# Patient Record
Sex: Female | Born: 1990
Health system: Southern US, Community
[De-identification: ages and names within clinical notes are randomized; demographics above are authoritative.]

## PROBLEM LIST (undated history)

## (undated) DIAGNOSIS — M419 Scoliosis, unspecified: Secondary | ICD-10-CM

---
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
## 2010-02-07 NOTE — Letter (Signed)
Summary: *Orthopedic No Show Letter  [facility]  [address]  [place], [place] [address]   Phone: [phone]  Fax: [phone]      05/07/2009    Bernette Redbird [address] [place], [place]  [address]    Dear Ms. Briski,   Our records indicate that you missed your scheduled appointment with Dr. Beaulah Corin on Monday, 05/02/09.  Please contact this office to reschedule your appointment as soon as possible.  It is important that you keep your scheduled appointments with your physician, so we can provide you the best care possible.        Sincerely,    Dr. Terrance Mass, MD [facility] Phone [phone]

## 2010-02-07 NOTE — Progress Notes (Signed)
Summary: Progress note  Progress note   Imported By: Jacklynn Ganong 04/28/2009 15:05:46  _____________________________________________________________________  External Attachment:    Type:   Image     Comment:   External Document

## 2010-02-07 NOTE — Progress Notes (Signed)
Summary: Initial  evaluation  Initial  evaluation   Imported By: Jacklynn Ganong 04/28/2009 16:40:26  _____________________________________________________________________  External Attachment:    Type:   Image     Comment:   External Document

## 2010-03-21 LAB — PREGNANCY, URINE: Preg Test, Ur: NEGATIVE

## 2010-03-21 LAB — POCT PREGNANCY, URINE: Preg Test, Ur: NEGATIVE

## 2010-03-21 LAB — URINALYSIS, ROUTINE W REFLEX MICROSCOPIC
Bilirubin Urine: NEGATIVE
Hgb urine dipstick: NEGATIVE
Nitrite: NEGATIVE
Specific Gravity, Urine: 1.025 (ref 1.005–1.030)
pH: 6 (ref 5.0–8.0)

## 2010-05-28 ENCOUNTER — Emergency Department (HOSPITAL_COMMUNITY)
Admission: EM | Admit: 2010-05-28 | Discharge: 2010-05-28 | Disposition: A | Discharge status: Home / Self Care | Payer: Self-pay | Attending: Emergency Medicine | Admitting: Emergency Medicine

## 2010-05-28 ENCOUNTER — Emergency Department (HOSPITAL_COMMUNITY): Payer: Self-pay

## 2010-05-28 DIAGNOSIS — F1010 Alcohol abuse, uncomplicated: Secondary | ICD-10-CM | POA: Insufficient documentation

## 2010-05-28 DIAGNOSIS — F411 Generalized anxiety disorder: Secondary | ICD-10-CM | POA: Insufficient documentation

## 2010-05-28 DIAGNOSIS — R0602 Shortness of breath: Secondary | ICD-10-CM | POA: Insufficient documentation

## 2010-05-28 LAB — BASIC METABOLIC PANEL
BUN: 11 mg/dL (ref 6–23)
Calcium: 10.3 mg/dL (ref 8.4–10.5)
Creatinine, Ser: 0.78 mg/dL (ref 0.4–1.2)
GFR calc non Af Amer: >60 mL/min (ref >60)
Glucose, Bld: 133 mg/dL — ABNORMAL HIGH (ref 70–99)
Potassium: 3 mEq/L — ABNORMAL LOW (ref 3.5–5.1)

## 2010-05-28 LAB — URINALYSIS, ROUTINE W REFLEX MICROSCOPIC
Glucose, UA: NEGATIVE mg/dL
Nitrite: NEGATIVE
Protein, ur: NEGATIVE mg/dL

## 2010-05-28 LAB — PREGNANCY, URINE: Preg Test, Ur: NEGATIVE

## 2010-05-28 LAB — D-DIMER, QUANTITATIVE: D-Dimer, Quant: 0.77 ug/mL-FEU — ABNORMAL HIGH (ref 0.00–0.48)

## 2010-05-28 LAB — CBC
Hemoglobin: 10.8 g/dL — ABNORMAL LOW (ref 12.0–15.0)
MCHC: 31 g/dL (ref 30.0–36.0)
Platelets: 479 10*3/uL — ABNORMAL HIGH (ref 150–400)
RDW: 16.7 % — ABNORMAL HIGH (ref 11.5–15.5)

## 2010-05-28 LAB — ETHANOL: Alcohol, Ethyl (B): 113 mg/dL — ABNORMAL HIGH (ref 0–10)

## 2010-05-28 LAB — RAPID URINE DRUG SCREEN, HOSP PERFORMED: Barbiturates: NOT DETECTED

## 2010-05-28 MED ORDER — IOHEXOL 350 MG/ML SOLN
100.0000 mL | Freq: Once | INTRAVENOUS | Status: AC | PRN
  Administered 2010-05-28: 100 mL via INTRAVENOUS

## 2010-12-08 ENCOUNTER — Encounter: Payer: Self-pay | Admitting: *Deleted

## 2010-12-08 ENCOUNTER — Emergency Department (HOSPITAL_COMMUNITY)
Admission: EM | Admit: 2010-12-08 | Discharge: 2010-12-08 | Disposition: A | Discharge status: Home / Self Care | Payer: Self-pay | Attending: Emergency Medicine | Admitting: Emergency Medicine

## 2010-12-08 DIAGNOSIS — F17200 Nicotine dependence, unspecified, uncomplicated: Secondary | ICD-10-CM | POA: Insufficient documentation

## 2010-12-08 DIAGNOSIS — M4120 Other idiopathic scoliosis, site unspecified: Secondary | ICD-10-CM | POA: Insufficient documentation

## 2010-12-08 DIAGNOSIS — M545 Low back pain, unspecified: Secondary | ICD-10-CM | POA: Insufficient documentation

## 2010-12-08 HISTORY — DX: Scoliosis, unspecified: M41.9

## 2010-12-08 LAB — PREGNANCY, URINE: Preg Test, Ur: NEGATIVE

## 2010-12-08 LAB — URINALYSIS, ROUTINE W REFLEX MICROSCOPIC
Glucose, UA: NEGATIVE mg/dL
Hgb urine dipstick: NEGATIVE
Leukocytes, UA: NEGATIVE
Specific Gravity, Urine: >1.03 — ABNORMAL HIGH (ref 1.005–1.030)
pH: 6 (ref 5.0–8.0)

## 2010-12-08 MED ORDER — OXYCODONE-ACETAMINOPHEN 5-325 MG PO TABS
1.0000 | ORAL_TABLET | Freq: Once | ORAL | Status: AC
  Administered 2010-12-08: 1 via ORAL
  Filled 2010-12-08: qty 1

## 2010-12-08 MED ORDER — OXYCODONE-ACETAMINOPHEN 5-325 MG PO TABS: 1.0000 | ORAL_TABLET | ORAL | Status: AC | PRN

## 2010-12-08 MED ORDER — PREDNISONE 20 MG PO TABS
60.0000 mg | ORAL_TABLET | Freq: Once | ORAL | Status: AC
  Administered 2010-12-08: 60 mg via ORAL
  Filled 2010-12-08: qty 3
  Filled 2010-12-08: qty 2

## 2010-12-08 MED ORDER — PREDNISONE 20 MG PO TABS: ORAL_TABLET | ORAL | Status: DC

## 2010-12-08 NOTE — ED Provider Notes (Signed)
History     CSN: [id number] Arrival date & time: 12/08/2010  9:45 PM   First MD Initiated Contact with Patient 12/08/10 2202      Chief Complaint  Patient presents with  . Back Pain    (Consider location/radiation/quality/duration/timing/severity/associated sxs/prior treatment) Patient is a 20 y.o. female presenting with back pain. The history is provided by the patient.  Back Pain  This is a recurrent problem. The current episode started 12 to 24 hours ago (Patient woke today with pain in her left lower back.  she denies injury, but does have known scoliosis with infrequent low back pain.). The problem occurs constantly. The problem has not changed since onset.The pain is associated with no known injury. The pain is present in the lumbar spine. The quality of the pain is described as aching. The pain does not radiate. The pain is at a severity of 5/10. The pain is moderate. The symptoms are aggravated by bending, twisting and certain positions. Pertinent negatives include no chest pain, no fever, no numbness, no headaches, no abdominal pain, no bowel incontinence, no dysuria, no leg pain, no paresthesias, no paresis, no tingling and no weakness. She has tried nothing for the symptoms.    Past Medical History  Diagnosis Date  . Scoliosis     History reviewed. No pertinent past surgical history.  History reviewed. No pertinent family history.  History  Substance Use Topics  . Smoking status: Current Some Day Smoker  . Smokeless tobacco: Not on file  . Alcohol Use: No    OB History    Grav Para Term Preterm Abortions TAB SAB Ect Mult Living                  Review of Systems  Constitutional: Negative for fever.  HENT: Negative for congestion, sore throat and neck pain.   Eyes: Negative.   Respiratory: Negative for chest tightness and shortness of breath.   Cardiovascular: Negative for chest pain.  Gastrointestinal: Negative for nausea, abdominal pain and bowel  incontinence.  Genitourinary: Negative.  Negative for dysuria.  Musculoskeletal: Positive for back pain. Negative for joint swelling and arthralgias.  Skin: Negative.  Negative for rash and wound.  Neurological: Negative for dizziness, tingling, weakness, light-headedness, numbness, headaches and paresthesias.  Hematological: Negative.   Psychiatric/Behavioral: Negative.     Allergies  Review of patient's allergies indicates no known allergies.  Home Medications   Current Outpatient Rx  Name Route Sig Dispense Refill  . OXYCODONE-ACETAMINOPHEN 5-325 MG PO TABS Oral Take 1 tablet by mouth every 4 (four) hours as needed for pain. 20 tablet 0  . PREDNISONE 20 MG PO TABS  Take 6 tabs daily by mouth for 1 day,  Then 5 tabs daily for 2 days,  4 tabs daily for 2 days,  3 tabs daily for 2 days,  2 tabs daily for 2 days,  Then 1 tab daily for 2 days.   36 tablet 0    BP 114/75  Pulse 82  Temp(Src) 98.5 F (36.9 C) (Oral)  Resp 18  Ht 5\' 2"  (1.575 m)  Wt 94 lb 6 oz (42.808 kg)  BMI 17.26 kg/m2  SpO2 100%  LMP 11/09/2010  Physical Exam  Nursing note and vitals reviewed. Constitutional: She is oriented to person, place, and time. She appears well-developed and well-nourished.  HENT:  Head: Normocephalic.  Eyes: Conjunctivae are normal.  Neck: Normal range of motion. Neck supple.  Cardiovascular: Regular rhythm and intact distal pulses.  Pedal pulses normal.  Pulmonary/Chest: Effort normal. She has no wheezes.  Abdominal: Soft. Bowel sounds are normal. She exhibits no distension and no mass.  Musculoskeletal: Normal range of motion. She exhibits no edema.       Lumbar back: She exhibits tenderness. She exhibits no swelling, no edema and no spasm.  Neurological: She is alert and oriented to person, place, and time. She has normal strength. She displays no atrophy and no tremor. No cranial nerve deficit or sensory deficit. Gait normal.  Reflex Scores:      Patellar reflexes are  2+ on the right side and 2+ on the left side.      Achilles reflexes are 2+ on the right side and 2+ on the left side.      No strength deficit noted in hip and knee flexor and extensor muscle groups.  Ankle flexion and extension intact.  Skin: Skin is warm and dry.  Psychiatric: She has a normal mood and affect.    ED Course  Procedures (including critical care time)  Labs Reviewed  URINALYSIS, ROUTINE W REFLEX MICROSCOPIC - Abnormal; Notable for the following:    Specific Gravity, Urine >1.030 (*)    All other components within normal limits  PREGNANCY, URINE   No results found.   1. Lumbar back pain       MDM  Low back pain without neuro deficit by exam or history.  Last tx with ibuprofen and flexeril per review of last visit for this complaint - pt reports did not get relief.  Will try prednisone taper,  Oxycodone ,  Strongly encourage pt see ortho f/u.        Candis Musa, PA 12/08/10 2349

## 2010-12-08 NOTE — ED Notes (Signed)
Low back pain  More to lt side.  No known injury

## 2010-12-08 NOTE — ED Notes (Addendum)
C/o lower back pain more on left side that radiates down into left thigh, denies any injury

## 2010-12-10 NOTE — ED Provider Notes (Signed)
Medical screening examination/treatment/procedure(s) were performed by non-physician practitioner and as supervising physician I was immediately available for consultation/collaboration.  Donnetta Hutching, MD 12/10/10 [phone]

## 2010-12-14 ENCOUNTER — Emergency Department (HOSPITAL_COMMUNITY): Payer: Self-pay

## 2010-12-14 ENCOUNTER — Emergency Department (HOSPITAL_COMMUNITY)
Admission: EM | Admit: 2010-12-14 | Discharge: 2010-12-14 | Disposition: A | Discharge status: Home / Self Care | Payer: Self-pay | Attending: Emergency Medicine | Admitting: Emergency Medicine

## 2010-12-14 ENCOUNTER — Encounter (HOSPITAL_COMMUNITY): Payer: Self-pay | Admitting: *Deleted

## 2010-12-14 DIAGNOSIS — M4120 Other idiopathic scoliosis, site unspecified: Secondary | ICD-10-CM | POA: Insufficient documentation

## 2010-12-14 DIAGNOSIS — M545 Low back pain, unspecified: Secondary | ICD-10-CM | POA: Insufficient documentation

## 2010-12-14 DIAGNOSIS — X58XXXA Exposure to other specified factors, initial encounter: Secondary | ICD-10-CM | POA: Insufficient documentation

## 2010-12-14 DIAGNOSIS — S335XXA Sprain of ligaments of lumbar spine, initial encounter: Secondary | ICD-10-CM | POA: Insufficient documentation

## 2010-12-14 DIAGNOSIS — F17200 Nicotine dependence, unspecified, uncomplicated: Secondary | ICD-10-CM | POA: Insufficient documentation

## 2010-12-14 LAB — POCT PREGNANCY, URINE: Preg Test, Ur: NEGATIVE

## 2010-12-14 MED ORDER — OXYCODONE-ACETAMINOPHEN 5-325 MG PO TABS
1.0000 | ORAL_TABLET | Freq: Once | ORAL | Status: AC
  Administered 2010-12-14: 1 via ORAL
  Filled 2010-12-14: qty 1

## 2010-12-14 MED ORDER — OXYCODONE-ACETAMINOPHEN 5-325 MG PO TABS: 1.0000 | ORAL_TABLET | ORAL | Status: AC | PRN

## 2010-12-14 MED ORDER — CYCLOBENZAPRINE HCL 5 MG PO TABS: 5.0000 mg | ORAL_TABLET | Freq: Three times a day (TID) | ORAL | Status: AC | PRN

## 2010-12-14 MED ORDER — CYCLOBENZAPRINE HCL 10 MG PO TABS
10.0000 mg | ORAL_TABLET | Freq: Once | ORAL | Status: AC
  Administered 2010-12-14: 10 mg via ORAL
  Filled 2010-12-14: qty 1

## 2010-12-14 NOTE — ED Provider Notes (Signed)
History     CSN: [id number] Arrival date & time: 12/14/2010 11:22 AM   First MD Initiated Contact with Patient 12/14/10 1209      Chief Complaint  Patient presents with  . Back Pain    (Consider location/radiation/quality/duration/timing/severity/associated sxs/prior treatment) Patient is a 20 y.o. female presenting with back pain. The history is provided by the patient.  Back Pain  This is a recurrent problem. The current episode started more than 1 week ago. The problem occurs constantly. The problem has not changed since onset.The pain is associated with no known injury (No known injury,  but patient does have a history of scoliosis.  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 The pain is present in the lumbar spine. The quality of the pain is described as aching and stabbing. The pain does not radiate. The pain is at a severity of 10/10. The pain is severe. The symptoms are aggravated by bending, twisting and certain positions. The pain is the same all the time. Pertinent negatives include no fever, no numbness, no abdominal swelling, no bowel incontinence, no perianal numbness, no dysuria, no pelvic pain, no leg pain, no paresthesias, no paresis, no tingling and no weakness. She has tried analgesics for the symptoms. The treatment provided no relief.    Past Medical History  Diagnosis Date  . Scoliosis     History reviewed. No pertinent past surgical history.  History reviewed. No pertinent family history.  History  Substance Use Topics  . Smoking status: Current Some Day Smoker  . Smokeless tobacco: Not on file  . Alcohol Use: No    OB History    Grav Para Term Preterm Abortions TAB SAB Ect Mult Living                  Review of Systems  Constitutional: Negative for fever.  Gastrointestinal: Negative for bowel incontinence.  Genitourinary: Negative for dysuria and pelvic pain.  Musculoskeletal: Positive for back pain.  Neurological: Negative for tingling,  weakness, numbness and paresthesias.    Allergies  Review of patient's allergies indicates no known allergies.  Home Medications   Current Outpatient Rx  Name Route Sig Dispense Refill  . CYCLOBENZAPRINE HCL 5 MG PO TABS Oral Take 1 tablet (5 mg total) by mouth 3 (three) times daily as needed for muscle spasms. 15 tablet 0  . OXYCODONE-ACETAMINOPHEN 5-325 MG PO TABS Oral Take 1 tablet by mouth every 4 (four) hours as needed for pain. 20 tablet 0  . OXYCODONE-ACETAMINOPHEN 5-325 MG PO TABS Oral Take 1 tablet by mouth every 4 (four) hours as needed for pain. 12 tablet 0  . PREDNISONE 20 MG PO TABS  Take 6 tabs daily by mouth for 1 day,  Then 5 tabs daily for 2 days,  4 tabs daily for 2 days,  3 tabs daily for 2 days,  2 tabs daily for 2 days,  Then 1 tab daily for 2 days.   36 tablet 0    BP 136/73  Pulse 83  Temp(Src) 98.8 F (37.1 C) (Oral)  Resp 20  Ht 5\' 2"  (1.575 m)  Wt 102 lb (46.267 kg)  BMI 18.66 kg/m2  SpO2 100%  LMP 11/09/2010  Physical Exam  ED Course  Procedures (including critical care time)   Labs Reviewed  POCT PREGNANCY, URINE   Dg Lumbar Spine Complete  12/14/2010  *RADIOLOGY REPORT*  Clinical Data: Bilateral leg pain  LUMBAR SPINE - COMPLETE 4+ VIEW  Comparison: November 11, 2009  Findings: The AP and lateral lumbar alignment are normal.  The pedicles and spinous processes are intact at all levels.  The vertebral body heights and disc spaces are maintained.  The oblique views reveal no evidence of pars interarticularis defects bilaterally.  IMPRESSION: Stable lumbar spine with no evidence of acute abnormality.  If there is clinical concern regarding herniated disc, MRI may be of help.  Original Report Authenticated By: Brandon Melnick, M.D.     1. Lumbar back sprain       MDM  Lumbar strain/sprain without neuro deficit on exam or by history.  Referral to ortho.        Candis Musa, PA 12/14/10 1736

## 2010-12-14 NOTE — ED Notes (Signed)
Pt c/o lower back pain that radiates down bilateral legs; pt denies any injury 

## 2010-12-14 NOTE — ED Notes (Signed)
Pt presents with low back pain that radiates down bilateral legs. Pt denies injury or trauma. No step-off, malformation, or bruising noted. Pt ambulates with steady gate.

## 2010-12-14 NOTE — ED Notes (Signed)
Pt a/ox4. resp even and unlabored. NAD at this time. D/C instructions and rx x 2 reviewed with pt. Pt verbalized understanding. Pt ambulated to lobby with steady gate.

## 2010-12-15 NOTE — ED Provider Notes (Signed)
Medical screening examination/treatment/procedure(s) were conducted as a shared visit with non-physician practitioner(s) and myself.  I personally evaluated the patient during the encounter..  back pain s radicular component  Donnetta Hutching, MD 12/15/10 [phone]

## 2011-08-28 ENCOUNTER — Emergency Department (HOSPITAL_COMMUNITY)
Admission: EM | Admit: 2011-08-28 | Discharge: 2011-08-28 | Disposition: A | Discharge status: Home / Self Care | Payer: Self-pay | Attending: Emergency Medicine | Admitting: Emergency Medicine

## 2011-08-28 ENCOUNTER — Encounter (HOSPITAL_COMMUNITY): Payer: Self-pay | Admitting: *Deleted

## 2011-08-28 ENCOUNTER — Emergency Department (HOSPITAL_COMMUNITY): Payer: Self-pay

## 2011-08-28 DIAGNOSIS — D649 Anemia, unspecified: Secondary | ICD-10-CM | POA: Insufficient documentation

## 2011-08-28 DIAGNOSIS — M545 Low back pain, unspecified: Secondary | ICD-10-CM | POA: Insufficient documentation

## 2011-08-28 DIAGNOSIS — M4120 Other idiopathic scoliosis, site unspecified: Secondary | ICD-10-CM | POA: Insufficient documentation

## 2011-08-28 DIAGNOSIS — F17200 Nicotine dependence, unspecified, uncomplicated: Secondary | ICD-10-CM | POA: Insufficient documentation

## 2011-08-28 LAB — CBC
MCH: 27.8 pg (ref 26.0–34.0)
MCHC: 32.4 g/dL (ref 30.0–36.0)
MCV: 85.8 fL (ref 78.0–100.0)
Platelets: 323 10*3/uL (ref 150–400)
RDW: 13.7 % (ref 11.5–15.5)

## 2011-08-28 LAB — BASIC METABOLIC PANEL
BUN: 11 mg/dL (ref 6–23)
CO2: 25 mEq/L (ref 19–32)
Calcium: 9.5 mg/dL (ref 8.4–10.5)
Creatinine, Ser: 0.75 mg/dL (ref 0.50–1.10)
Glucose, Bld: 91 mg/dL (ref 70–99)

## 2011-08-28 MED ORDER — HYDROCODONE-ACETAMINOPHEN 5-325 MG PO TABS
1.0000 | ORAL_TABLET | Freq: Once | ORAL | Status: AC
  Administered 2011-08-28: 1 via ORAL
  Filled 2011-08-28: qty 1

## 2011-08-28 MED ORDER — HYDROCODONE-ACETAMINOPHEN 5-325 MG PO TABS: 1.0000 | ORAL_TABLET | ORAL | Status: AC | PRN

## 2011-08-28 MED ORDER — IBUPROFEN 400 MG PO TABS: 400.0000 mg | ORAL_TABLET | Freq: Four times a day (QID) | ORAL | Status: AC | PRN

## 2011-08-28 MED ORDER — IBUPROFEN 400 MG PO TABS
400.0000 mg | ORAL_TABLET | Freq: Once | ORAL | Status: AC
  Administered 2011-08-28: 400 mg via ORAL
  Filled 2011-08-28: qty 1

## 2011-08-28 NOTE — ED Notes (Signed)
Low back pain with radiation down both legs.  , dizzy at times, nausea.

## 2011-08-28 NOTE — ED Notes (Signed)
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

## 2011-08-28 NOTE — ED Notes (Signed)
Discharge instructions reviewed with pt, questions answered. Pt verbalized understanding.  

## 2011-08-31 NOTE — ED Provider Notes (Signed)
History     CSN: [id number]  Arrival date & time 08/28/11  1110   First MD Initiated Contact with Patient 08/28/11 1121      Chief Complaint  Patient presents with  . Back Pain    (Consider location/radiation/quality/duration/timing/severity/associated sxs/prior treatment) HPI Comments: Sandra Mueller presents with several complaints,  The main being acute on chronic low back pain.  She has scoliosis and is working in a job that requires moderate lifting which she believes aggravates her low back pain.  She does have radiation of pain down both posterior mid thighs which is similar to previous flare ups.  She denies distal weakness and denies urinary or bowel retention or incontinence.  She also reports she feels light headed occasionally when she stands too quickly.  She does have a history anemia.  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  She has not discussed these concerns with her pcp. She has no fevers, chills, abdominal pain, dysuria,  Chest pain or shortness of breath.  The history is provided by the patient.    Past Medical History  Diagnosis Date  . Scoliosis     History reviewed. No pertinent past surgical history.  History reviewed. No pertinent family history.  History  Substance Use Topics  . Smoking status: Current Some Day Smoker  . Smokeless tobacco: Not on file  . Alcohol Use: No    OB History    Grav Para Term Preterm Abortions TAB SAB Ect Mult Living                  Review of Systems  Constitutional: Negative for fever, activity change and fatigue.  HENT: Negative for sore throat.   Eyes: Negative.   Respiratory: Negative for shortness of breath.   Cardiovascular: Negative for chest pain and leg swelling.  Gastrointestinal: Negative for vomiting, abdominal pain, diarrhea, constipation and abdominal distention.  Genitourinary: Negative for dysuria, urgency, frequency, flank pain and difficulty urinating.    Musculoskeletal: Positive for back pain. Negative for joint swelling and gait problem.  Skin: Negative for rash and wound.  Neurological: Positive for light-headedness. Negative for weakness and numbness.  Hematological: Negative for adenopathy. Does not bruise/bleed easily.    Allergies  Review of patient's allergies indicates no known allergies.  Home Medications   Current Outpatient Rx  Name Route Sig Dispense Refill  . HYDROCODONE-ACETAMINOPHEN 5-325 MG PO TABS Oral Take 1 tablet by mouth every 4 (four) hours as needed for pain. 20 tablet 0  . IBUPROFEN 400 MG PO TABS Oral Take 1 tablet (400 mg total) by mouth every 6 (six) hours as needed for pain. 20 tablet 0    BP 105/84  Pulse 73  Temp 98.4 F (36.9 C) (Oral)  Resp 20  Ht 5\' 2"  (1.575 m)  Wt 102 lb (46.267 kg)  BMI 18.66 kg/m2  SpO2 97%  LMP 08/22/2011  Physical Exam  Nursing note and vitals reviewed. Constitutional: She appears well-developed and well-nourished.       Thin, without cachexia.  HENT:  Head: Normocephalic and atraumatic.  Eyes: Conjunctivae are normal.  Neck: Normal range of motion. Neck supple.  Cardiovascular: Normal rate, regular rhythm, normal heart sounds and intact distal pulses.   No murmur heard.      Pedal pulses normal.  Pulmonary/Chest: Effort normal and breath sounds normal. She has no wheezes.  Abdominal: Soft. Bowel sounds are normal. She exhibits no distension and no mass.  There is no tenderness.  Musculoskeletal: Normal range of motion. She exhibits no edema.       Lumbar back: She exhibits tenderness. She exhibits no swelling, no edema and no spasm.       Paralumbar ttp.    Neurological: She is alert. She has normal strength. She displays no atrophy and no tremor. No sensory deficit. Gait normal.  Reflex Scores:      Patellar reflexes are 2+ on the right side and 2+ on the left side.      Achilles reflexes are 2+ on the right side and 2+ on the left side.      No strength  deficit noted in hip and knee flexor and extensor muscle groups.  Ankle flexion and extension intact.  Skin: Skin is warm and dry.  Psychiatric: She has a normal mood and affect.    ED Course  Procedures (including critical care time)  Labs Reviewed  CBC - Abnormal; Notable for the following:    Hemoglobin 11.1 (*)     HCT 34.3 (*)     All other components within normal limits  BASIC METABOLIC PANEL  POCT PREGNANCY, URINE  LAB REPORT - SCANNED   No results found.   1. Lumbar spine pain   2. Anemia       MDM  At dc,  Patient reports overdue for her yearly physical - encouraged to make appt for this.  Prescribed hydrocodone for pain relief.  Referral to Dr. Nickola Major for management of her chronic back problems.  Work note given.  No neuro deficit on exam or by history to suggest emergent or surgical presentation.  Also discussed worsened sx that should prompt immediate re-evaluation including distal weakness, bowel/bladder retention/incontinence.              Burgess Amor, [place] 08/31/11 [phone]

## 2011-08-31 NOTE — ED Provider Notes (Signed)
Medical screening examination/treatment/procedure(s) were performed by non-physician practitioner and as supervising physician I was immediately available for consultation/collaboration.  Glynn Octave, MD 08/31/11 [phone]

## 2012-01-13 ENCOUNTER — Emergency Department (HOSPITAL_COMMUNITY)
Admission: EM | Admit: 2012-01-13 | Discharge: 2012-01-13 | Disposition: A | Discharge status: Home / Self Care | Payer: Self-pay | Attending: Emergency Medicine | Admitting: Emergency Medicine

## 2012-01-13 ENCOUNTER — Encounter (HOSPITAL_COMMUNITY): Payer: Self-pay | Admitting: *Deleted

## 2012-01-13 DIAGNOSIS — J3489 Other specified disorders of nose and nasal sinuses: Secondary | ICD-10-CM | POA: Insufficient documentation

## 2012-01-13 DIAGNOSIS — F17200 Nicotine dependence, unspecified, uncomplicated: Secondary | ICD-10-CM | POA: Insufficient documentation

## 2012-01-13 DIAGNOSIS — M4120 Other idiopathic scoliosis, site unspecified: Secondary | ICD-10-CM | POA: Insufficient documentation

## 2012-01-13 DIAGNOSIS — J069 Acute upper respiratory infection, unspecified: Secondary | ICD-10-CM | POA: Insufficient documentation

## 2012-01-13 MED ORDER — MAGIC MOUTHWASH W/LIDOCAINE: ORAL | Status: DC

## 2012-01-13 MED ORDER — BENZONATATE 200 MG PO CAPS: 200.0000 mg | ORAL_CAPSULE | Freq: Three times a day (TID) | ORAL | Status: DC | PRN

## 2012-01-13 NOTE — ED Notes (Signed)
Sore throat x 2 days

## 2012-01-13 NOTE — ED Notes (Signed)
Patient with no complaints at this time. Respirations even and unlabored. Skin warm/dry. Discharge instructions reviewed with patient at this time. Patient given opportunity to voice concerns/ask questions. Patient discharged at this time and left Emergency Department with steady gait.   

## 2012-01-14 NOTE — ED Provider Notes (Signed)
History     CSN: [id number]  Arrival date & time 01/13/12  1114   First MD Initiated Contact with Patient 01/13/12 1129      Chief Complaint  Patient presents with  . Sore Throat    (Consider location/radiation/quality/duration/timing/severity/associated sxs/prior treatment) Patient is a 22 y.o. female presenting with pharyngitis. The history is provided by the patient.  Sore Throat This is a new problem. The current episode started in the past 7 days. The problem occurs constantly. The problem has been unchanged. Associated symptoms include congestion and a sore throat. Pertinent negatives include no abdominal pain, anorexia, arthralgias, chest pain, chills, coughing, diaphoresis, fever, headaches, joint swelling, nausea, neck pain, numbness, rash, swollen glands, vomiting or weakness. The symptoms are aggravated by swallowing. She has tried nothing for the symptoms. The treatment provided no relief.    Past Medical History  Diagnosis Date  . Scoliosis     History reviewed. No pertinent past surgical history.  No family history on file.  History  Substance Use Topics  . Smoking status: Light Tobacco Smoker    Types: Cigarettes  . Smokeless tobacco: Not on file  . Alcohol Use: No    OB History    Grav Para Term Preterm Abortions TAB SAB Ect Mult Living                  Review of Systems  Constitutional: Negative for fever, chills, diaphoresis, activity change and appetite change.  HENT: Positive for congestion and sore throat. Negative for ear pain, facial swelling, rhinorrhea, trouble swallowing, neck pain, neck stiffness and voice change.   Eyes: Negative for pain and visual disturbance.  Respiratory: Negative for cough, chest tightness, shortness of breath and wheezing.   Cardiovascular: Negative for chest pain.  Gastrointestinal: Negative for nausea, vomiting, abdominal pain and anorexia.  Genitourinary: Negative for dysuria and flank pain.  Musculoskeletal:  Negative for joint swelling and arthralgias.  Skin: Negative for color change and rash.  Neurological: Negative for dizziness, facial asymmetry, speech difficulty, weakness, numbness and headaches.  Hematological: Negative for adenopathy.  All other systems reviewed and are negative.    Allergies  Review of patient's allergies indicates no known allergies.  Home Medications   Current Outpatient Rx  Name  Route  Sig  Dispense  Refill  . MAGIC MOUTHWASH W/LIDOCAINE      5 ml po swish and spit TID prn   60 mL   0   . BENZONATATE 200 MG PO CAPS   Oral   Take 1 capsule (200 mg total) by mouth 3 (three) times daily as needed for cough. Swallow whole do not chew   21 capsule   0     BP 114/83  Pulse 92  Temp 98 F (36.7 C) (Oral)  Resp 16  Ht 5\' 3"  (1.6 m)  Wt 102 lb (46.267 kg)  BMI 18.07 kg/m2  SpO2 100%  LMP 01/10/2012  Physical Exam  Nursing note and vitals reviewed. Constitutional: She is oriented to person, place, and time. She appears well-developed and well-nourished. No distress.  HENT:  Head: Normocephalic and atraumatic. No trismus in the jaw.  Right Ear: Tympanic membrane and ear canal normal.  Left Ear: Tympanic membrane and ear canal normal.  Mouth/Throat: Uvula is midline and mucous membranes are normal. No uvula swelling. Posterior oropharyngeal erythema present. No oropharyngeal exudate, posterior oropharyngeal edema or tonsillar abscesses.       Scant erythema of the oropharynx.  No edema or exudates.  No PTA  Eyes: EOM are normal. Pupils are equal, round, and reactive to light.  Neck: Normal range of motion and phonation normal. Neck supple. No spinous process tenderness and no muscular tenderness present. No rigidity. No edema and no erythema present. No Brudzinski's sign and no Kernig's sign noted.  Cardiovascular: Normal rate, regular rhythm, normal heart sounds and intact distal pulses.   No murmur heard. Pulmonary/Chest: Effort normal and breath  sounds normal.  Musculoskeletal: Normal range of motion.  Lymphadenopathy:    She has no cervical adenopathy.  Neurological: She is alert and oriented to person, place, and time. She exhibits normal muscle tone. Coordination normal.  Skin: Skin is warm and dry.    ED Course  Procedures (including critical care time)  Labs Reviewed - No data to display No results found.   1. URI (upper respiratory infection)       MDM       Pt is well appearing, vitals reviewed.  Likely viral illness.  No hx of body aches or fever.  Doubt influenza.  Pt agrees to fluids, symptomatic treatment and to f/u with her PMD if needed   Malacai Grantz L. Kellianne Ek, [place] 01/14/12 2316

## 2012-01-16 NOTE — ED Provider Notes (Signed)
Medical screening examination/treatment/procedure(s) were performed by non-physician practitioner and as supervising physician I was immediately available for consultation/collaboration.   Benny Lennert, MD 01/16/12 [phone]

## 2012-04-11 ENCOUNTER — Encounter (HOSPITAL_COMMUNITY): Payer: Self-pay | Admitting: Emergency Medicine

## 2012-04-11 ENCOUNTER — Emergency Department (HOSPITAL_COMMUNITY): Payer: Self-pay

## 2012-04-11 ENCOUNTER — Emergency Department (HOSPITAL_COMMUNITY)
Admission: EM | Admit: 2012-04-11 | Discharge: 2012-04-11 | Disposition: A | Discharge status: Home / Self Care | Payer: Self-pay | Attending: Emergency Medicine | Admitting: Emergency Medicine

## 2012-04-11 DIAGNOSIS — R109 Unspecified abdominal pain: Secondary | ICD-10-CM | POA: Insufficient documentation

## 2012-04-11 DIAGNOSIS — M545 Low back pain, unspecified: Secondary | ICD-10-CM | POA: Insufficient documentation

## 2012-04-11 DIAGNOSIS — N6320 Unspecified lump in the left breast, unspecified quadrant: Secondary | ICD-10-CM

## 2012-04-11 DIAGNOSIS — N63 Unspecified lump in unspecified breast: Secondary | ICD-10-CM | POA: Insufficient documentation

## 2012-04-11 DIAGNOSIS — Z8739 Personal history of other diseases of the musculoskeletal system and connective tissue: Secondary | ICD-10-CM | POA: Insufficient documentation

## 2012-04-11 DIAGNOSIS — Z87891 Personal history of nicotine dependence: Secondary | ICD-10-CM | POA: Insufficient documentation

## 2012-04-11 MED ORDER — HYDROCODONE-ACETAMINOPHEN 5-325 MG PO TABS
1.0000 | ORAL_TABLET | Freq: Once | ORAL | Status: AC
  Administered 2012-04-11: 1 via ORAL
  Filled 2012-04-11: qty 1

## 2012-04-11 MED ORDER — IBUPROFEN 800 MG PO TABS
800.0000 mg | ORAL_TABLET | Freq: Once | ORAL | Status: AC
  Administered 2012-04-11: 800 mg via ORAL
  Filled 2012-04-11: qty 1

## 2012-04-11 NOTE — ED Notes (Signed)
Patient complaining of back pain x 1 week and "lump" in left breast noticed yesterday. States "I'm usually treated for back pain because of my scoliosis."

## 2012-04-11 NOTE — ED Provider Notes (Addendum)
History     CSN: [id number]  Arrival date & time 04/11/12  0301   First MD Initiated Contact with Patient 04/11/12 0340      Chief Complaint  Patient presents with  . Back Pain  . Breast Mass    (Consider location/radiation/quality/duration/timing/severity/associated sxs/prior treatment) HPI Sandra Mueller is a 22 y.o. female who presents to the Emergency Department complaining of left sided lower abdominal pain that she has had for a week off and on and has now radiates to her left lower back.  She started her period a week ago and the pain started around the same time. In addition she found a mass in her left breast yesterday while taking a shower. Its in her left breast and does not cause pain.  Past Medical History  Diagnosis Date  . Scoliosis     History reviewed. No pertinent past surgical history.  History reviewed. No pertinent family history.  History  Substance Use Topics  . Smoking status: Former Smoker    Types: Cigarettes  . Smokeless tobacco: Not on file  . Alcohol Use: No    OB History   Grav Para Term Preterm Abortions TAB SAB Ect Mult Living                  Review of Systems  Constitutional: Negative for fever.       10 Systems reviewed and are negative for acute change except as noted in the HPI.  HENT: Negative for congestion.   Eyes: Negative for discharge and redness.  Respiratory: Negative for cough and shortness of breath.        Left breast mass  Cardiovascular: Negative for chest pain.  Gastrointestinal: Positive for abdominal pain. Negative for vomiting.  Musculoskeletal: Negative for back pain.  Skin: Negative for rash.  Neurological: Negative for syncope, numbness and headaches.  Psychiatric/Behavioral:       No behavior change.    Allergies  Review of patient's allergies indicates no known allergies.  Home Medications   Current Outpatient Rx  Name  Route  Sig  Dispense  Refill  . Alum & Mag Hydroxide-Simeth (MAGIC MOUTHWASH  W/LIDOCAINE) SOLN      5 ml po swish and spit TID prn   60 mL   0   . benzonatate (TESSALON) 200 MG capsule   Oral   Take 1 capsule (200 mg total) by mouth 3 (three) times daily as needed for cough. Swallow whole do not chew   21 capsule   0     BP 123/81  Pulse 83  Temp(Src) 97.7 F (36.5 C) (Oral)  Resp 14  Ht 5\' 2"  (1.575 m)  Wt 103 lb (46.72 kg)  BMI 18.83 kg/m2  SpO2 100%  LMP 03/28/2012  Physical Exam  Nursing note and vitals reviewed. Constitutional: She appears well-developed and well-nourished.  Awake, alert, nontoxic appearance.  HENT:  Head: Normocephalic and atraumatic.  Left Ear: External ear normal.  Eyes: EOM are normal. Pupils are equal, round, and reactive to light.  Neck: Neck supple.  Cardiovascular: Normal rate and intact distal pulses.   Pulmonary/Chest: Effort normal. She exhibits no tenderness.  Left breast with ovoid mass 2 cm x 1 cm, freely moves, non tender, hard   Abdominal: Soft. There is tenderness. There is no rebound.  Left lower abdominal discomfort with palpation.  Musculoskeletal: She exhibits no tenderness.  Baseline ROM, no obvious new focal weakness.  Neurological:  Mental status and motor strength appears baseline for  patient and situation.  Skin: No rash noted.  Psychiatric: She has a normal mood and affect.    ED Course  Procedures (including critical care time)  Dg Abd Acute W/chest  04/11/2012  *RADIOLOGY REPORT*  Clinical Data: Left lower back pain and abdominal pain.  ACUTE ABDOMEN SERIES (ABDOMEN 2 VIEW & CHEST 1 VIEW)  Comparison: None.  Findings: The pulmonary hyperinflation. The heart size and pulmonary vascularity are normal. The lungs appear clear and expanded without focal air space disease or consolidation. No blunting of the costophrenic angles.  No pneumothorax.  Mediastinal contours appear intact.  Gas and stool in the colon.  No small or large bowel distension. No free intra-abdominal air.  No abnormal air fluid  levels. Visualized bones appear intact. No radiopaque stones.  Calcified phleboliths in the pelvis.  IMPRESSION: No evidence of active pulmonary disease.  Nonobstructive bowel gas pattern.   Original Report Authenticated By: Burman Nieves, M.D.    Patient was scheduled for [place] of left breast. 04/16/2012 at 8 AM. She has been referred to Dr. Leticia Penna. MDM  Patient here with abdominal pain and with a breast mass. Abdominal pain with a negative xray. Given hydrocodone with relief. Breast mass in left breast. [place] time made and referral to Dr. Lovell Sheehan made. Reviewed results with patient. Pt stable in ED with no significant deterioration in condition.The patient appears reasonably screened and/or stabilized for discharge and I doubt any other medical condition or other [facility] requiring further screening, evaluation, or treatment in the ED at this time prior to discharge.  MDM Reviewed: nursing note and vitals Interpretation: x-ray           Nicoletta Dress. Colon Branch, MD 04/11/12 Jeralyn Bennett  Nicoletta Dress. Colon Branch, MD 04/11/12 [phone]

## 2012-04-16 ENCOUNTER — Ambulatory Visit (HOSPITAL_COMMUNITY)
Admit: 2012-04-16 | Discharge: 2012-04-16 | Disposition: A | Discharge status: Home / Self Care | Payer: Self-pay | Source: Ambulatory Visit | Attending: Emergency Medicine | Admitting: Emergency Medicine

## 2012-04-16 DIAGNOSIS — N63 Unspecified lump in unspecified breast: Secondary | ICD-10-CM | POA: Insufficient documentation

## 2012-11-26 ENCOUNTER — Ambulatory Visit (INDEPENDENT_AMBULATORY_CARE_PROVIDER_SITE_OTHER): Payer: BC Managed Care – PPO | Admitting: Physician Assistant

## 2012-11-26 ENCOUNTER — Encounter: Payer: Self-pay | Admitting: Physician Assistant

## 2012-11-26 VITALS — BP 110/74 | HR 80 | Temp 98.6°F | Resp 14 | Ht 63.0 in | Wt 99.0 lb

## 2012-11-26 DIAGNOSIS — M545 Low back pain: Secondary | ICD-10-CM

## 2012-11-26 MED ORDER — CYCLOBENZAPRINE HCL 5 MG PO TABS: 5.0000 mg | ORAL_TABLET | Freq: Three times a day (TID) | ORAL | Status: DC | PRN

## 2012-11-26 NOTE — Progress Notes (Signed)
Patient ID: Sandra Mueller MRN: [id number], DOB: [birth year], 22 y.o. Date of Encounter: 11/26/2012, 10:47 AM    Chief Complaint:  Chief Complaint  Patient presents with  . NP - Back pain     HPI: 21 y.o. year old AA female is here with her mother. They report that she is experiencing some pain in her right low back. Patient states she has had problems with this off and on for years but that it recently has been bothering her worse. Mother reports that she has been seen in the ER about this. Mother states that they have been told that she has scoliosis and mom is requesting referral to a specialist.  Patient is reporting some discomfort in her right low back. No radiation of the pain down either leg. No numbness or tingling or weakness in either leg. 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 She says that she does a lot of standing and bending but no lifting. Says that she just takes the packages of wipes and pack them into a box.     Home Meds: See attached medication section for any medications that were entered at today's visit. The computer does not put those onto this list.The following list is a list of meds entered prior to today's visit.   No current outpatient prescriptions on file prior to visit.   No current facility-administered medications on file prior to visit.    Allergies: No Known Allergies    Review of Systems: See HPI for pertinent ROS. All other ROS negative.    Physical Exam: Blood pressure 110/74, pulse 80, temperature 98.6 F (37 C), temperature source Oral, resp. rate 14, height 5\' 3"  (1.6 m), weight 99 lb (44.906 kg)., Body mass index is 17.54 kg/(m^2). General: A very petitely built [place] American female. Appears in no acute distress. Neck: Supple. No thyromegaly. No lymphadenopathy. Lungs: Clear bilaterally to auscultation without wheezes, rales, or rhonchi. Breathing is unlabored. Heart: Regular rhythm. No  murmurs, rubs, or gallops. Msk:  Strength and tone normal for age. She points to her right low back as the area of her discomfort. However I finally palpated the area and she does not show any signs of or complaints of pain. She is able to walk in and out of the exam room normal. She is able to lie back and sit back up on the exam table without any difficulty. Straight leg raise is normal bilaterally. Hip Abduction is normal bilaterally. Extremities/Skin: Warm and dry. No clubbing or cyanosis. No edema. No rashes or suspicious lesions. Neuro: Alert and oriented X 3. Moves all extremities spontaneously. Gait is normal. CNII-XII grossly in tact. Psych:  Responds to questions appropriately with a normal affect.     ASSESSMENT AND PLAN:  22 y.o. year old female with  1. Low back pain I reviewed the ER notes in the computer. There are 3 ER notes regarding back pain. I specifically reviewed her exam as well as her assessment plan. Each of these is consistent with his musculoskeletal back pain. As well I have reviewed multiple lumbar spine x-ray report in the computer. These are all normal. I specifically reviewed ones dated 11/11/09, 12/14/10, 08/28/11. Even at the end of the visit mom is still making remarks about seeing a specialist. I see no indication of an need for referral to a specialist. We'll treat with muscle relaxers as needed. Cautioned this may cause drowsiness and that this is the  case to use it at night only. Other discussed applying heat to the area as well as need for routine stretching. - cyclobenzaprine (FLEXERIL) 5 MG tablet; Take 1 tablet (5 mg total) by mouth 3 (three) times daily as needed for muscle spasms.  Dispense: 30 tablet; Refill: 1   Signed, [address] [place], [place], [facility] 11/26/2012 10:47 AM

## 2012-12-01 ENCOUNTER — Encounter: Payer: Self-pay | Admitting: Obstetrics and Gynecology

## 2012-12-01 ENCOUNTER — Telehealth: Payer: Self-pay | Admitting: *Deleted

## 2012-12-01 ENCOUNTER — Ambulatory Visit (INDEPENDENT_AMBULATORY_CARE_PROVIDER_SITE_OTHER): Payer: BC Managed Care – PPO | Admitting: Obstetrics and Gynecology

## 2012-12-01 ENCOUNTER — Other Ambulatory Visit (HOSPITAL_COMMUNITY)
Admission: RE | Admit: 2012-12-01 | Discharge: 2012-12-01 | Disposition: A | Discharge status: Home / Self Care | Payer: BC Managed Care – PPO | Source: Ambulatory Visit | Attending: Obstetrics and Gynecology | Admitting: Obstetrics and Gynecology

## 2012-12-01 VITALS — BP 122/58 | Ht 64.0 in | Wt 97.0 lb

## 2012-12-01 DIAGNOSIS — Z01419 Encounter for gynecological examination (general) (routine) without abnormal findings: Secondary | ICD-10-CM

## 2012-12-01 DIAGNOSIS — N6320 Unspecified lump in the left breast, unspecified quadrant: Secondary | ICD-10-CM | POA: Insufficient documentation

## 2012-12-01 NOTE — Patient Instructions (Signed)
°

## 2012-12-01 NOTE — Telephone Encounter (Signed)
Left message of appt at [facility] radiology for u/s of breast on 12/03/2012 at 11:10 per pt ok to leave message.

## 2012-12-01 NOTE — Progress Notes (Signed)
Patient ID: Sandra Mueller, female   DOB: [birth year], 22 y.o.   MRN: [id number] Patient here today for her routine pap and physical  Assessment:  Tri-Annual Gyn Exam  Left Breast cyst/mass, for u/s Plan:  1. pap smear done, next pap due 3 yr 2. return  annually or prn for cyst 3  Breast u/s ordered. Subjective:  Sandra Mueller is a 22 y.o. female G0 P0, no female partners,  ,  No obstetric history on file. who presents for annual exam. [name] last menstrual period was 11/27/2012. The patient has complaints today of left br cyst  The following portions of the patient's history were reviewed and updated as appropriate: allergies, current medications, past family history, past medical history, past social history, past surgical history and problem list.  Review of Systems Constitutional: negative Gastrointestinal: negative Genitourinary: regular cycles. Breast mass seems to come and go in size.  Objective:  BP 122/58  Ht 5\' 4"  (1.626 m)  Wt 97 lb (43.999 kg)  BMI 16.64 kg/m2  LMP 11/27/2012   BMI: Body mass index is 16.64 kg/(m^2).  General Appearance: Alert, appropriate appearance for age. No acute distress HEENT: Grossly normal Neck / Thyroid:  Cardiovascular: RRR; normal S1, S2, no murmur Lungs: CTA bilaterally Back: No CVAT Breast Exam: bilateral fibrocystic changes and discrete mass, left  Lower outer quadrant, 2 cm x 1 cm x 1 cm Gastrointestinal: Soft, non-tender, no masses or organomegaly Pelvic Exam: Vulva and vagina appear normal. Bimanual exam reveals normal uterus and adnexa. Rectovaginal: not indicated Lymphatic Exam: Non-palpable nodes in neck, clavicular, axillary, or inguinal regions Skin: no rash or abnormalities Neurologic: Normal gait and speech, no tremor  Psychiatric: Alert and oriented, appropriate affect.  Urinalysis:Not done  Sandra Mueller. MD Pgr [phone] 11:55 AM

## 2012-12-03 ENCOUNTER — Ambulatory Visit (HOSPITAL_COMMUNITY): Payer: BC Managed Care – PPO | Attending: Obstetrics and Gynecology

## 2015-02-09 ENCOUNTER — Telehealth: Payer: Self-pay | Admitting: Physician Assistant

## 2015-02-09 ENCOUNTER — Encounter: Payer: Self-pay | Admitting: Family Medicine

## 2015-02-09 ENCOUNTER — Ambulatory Visit (INDEPENDENT_AMBULATORY_CARE_PROVIDER_SITE_OTHER): Payer: 59 | Admitting: Family Medicine

## 2015-02-09 VITALS — BP 100/68 | HR 80 | Temp 99.0°F | Resp 18 | Wt 92.0 lb

## 2015-02-09 DIAGNOSIS — M4120 Other idiopathic scoliosis, site unspecified: Secondary | ICD-10-CM | POA: Diagnosis not present

## 2015-02-09 DIAGNOSIS — Z23 Encounter for immunization: Secondary | ICD-10-CM | POA: Diagnosis not present

## 2015-02-09 DIAGNOSIS — M5126 Other intervertebral disc displacement, lumbar region: Secondary | ICD-10-CM

## 2015-02-09 DIAGNOSIS — M5416 Radiculopathy, lumbar region: Secondary | ICD-10-CM | POA: Diagnosis not present

## 2015-02-09 DIAGNOSIS — N63 Unspecified lump in breast: Secondary | ICD-10-CM | POA: Diagnosis not present

## 2015-02-09 DIAGNOSIS — N6320 Unspecified lump in the left breast, unspecified quadrant: Secondary | ICD-10-CM

## 2015-02-09 DIAGNOSIS — M5136 Other intervertebral disc degeneration, lumbar region: Secondary | ICD-10-CM

## 2015-02-09 MED ORDER — METHYLPREDNISOLONE 4 MG PO TBPK: ORAL_TABLET | ORAL | Status: DC

## 2015-02-09 MED ORDER — METHOCARBAMOL 500 MG PO TABS: 500.0000 mg | ORAL_TABLET | Freq: Two times a day (BID) | ORAL | Status: DC | PRN

## 2015-02-09 MED ORDER — CYCLOBENZAPRINE HCL 5 MG PO TABS: 5.0000 mg | ORAL_TABLET | Freq: Three times a day (TID) | ORAL | Status: DC | PRN

## 2015-02-09 NOTE — Progress Notes (Signed)
Patient ID: Sandra Mueller, female   DOB: 07/20/1990, 25 y.o.   MRN: [id number]   Subjective:    Patient ID: Sandra Mueller, female    DOB: [birth year], 25 y.o.   MRN: [id number]  Patient presents for Back Pain and Breast Pain  patient here with not in her left breast. She is actually had a breast mass for the past 3 years. She had ultrasound done which was concerning for probable fibroadenoma they recommended ultrasound-guided biopsy Emmaline Life this was not done. She did see GYN who thought this was benign. She was post go yearly for rechecks but she never did. She states that it gets larger and smaller depending on the month. But is very painful and she gets sharp pains that she threw. She would like to have it surgically removed. There is no family history of any breast cancer.   She has chronic back pain however for the past week she has had  Numbness in her right lower extremity. States that she has tingling numbness on her feet as well. She also feels weak on that side. It is worse when she is driving. She denies any specific injury. She does have known scoliosis she also had a disc bulge at L4-L5 this was noted on MRI back in 2008. She followed with orthopedics for a few visits but states that they were only giving her muscle relaxers and anti-inflammatory. Therefore she stopped going. She's had problems with her back on and off the past 3 years but has never had the numbness that has lasted this long.    Review Of Systems:  GEN- denies fatigue, fever, weight loss,weakness, recent illness HEENT- denies eye drainage, change in vision, nasal discharge, CVS- denies chest pain, palpitations RESP- denies SOB, cough, wheeze ABD- denies N/V, change in stools, abd pain GU- denies dysuria, hematuria, dribbling, incontinence MSK-+joint pain, muscle aches, injury Neuro- denies headache, dizziness, syncope, seizure activity       Objective:    BP 100/68 mmHg  Pulse 80  Temp(Src) 99 F (37.2 C)  (Oral)  Resp 18  Wt 92 lb (41.731 kg) GEN- NAD, alert and oriented x3 HEENT- PERRL, EOMI, non injected sclera, pink conjunctiva, MMM, oropharynx clear Neck- Supple, no thyromegaly CVS- RRR, no murmur RESP-CTAB Breast- normal symmetry, no nipple inversion,no nipple drainage, left breast 3 oclock position, 2x3cm mass, mild TTP, no fluctuance Nodes- no axillary nodes MSK- TTP lumbar spine, +SLR, decreased ROM spine  Neuro- CNII-XII intact, decreased strength RLE compared to left, normal tone, decreased sensation from shin to great toe with monofilament ABD-NABS,soft,NT,ND EXT- No edema Pulses- Radial, DP- 2+        Assessment & Plan:      Problem List Items Addressed This Visit    None    Visit Diagnoses    Need for vaccination    -  Primary    Relevant Orders    HPV vaccine quadravalent 3 dose IM (Completed)    Left breast mass        Significant mass, based on previous imaging fibroadenoma seems larger, diagnostic mammogram and     Idiopathic scoliosis        Lumbar nerve root impingement        Relevant Medications    methocarbamol (ROBAXIN) 500 MG tablet    L4-L5 disc bulge        Known disc bulge ,with symptoms of nerve root impingment, weakness, on right LE and decreased monofilament. Obtain MRI lumbar spine, had MRI >  5 years ago. Medrol dosepak given, robaxin for spasm        Note: This dictation was prepared with Dragon dictation along with smaller phrase technology. Any transcriptional errors that result from this process are unintentional.

## 2015-02-09 NOTE — Telephone Encounter (Signed)
Pt's Grandmother, Bethena Roys is calling to see if Dr. Buelah Manis will call her in a pain pill for her back pain. [employer] [phone]

## 2015-02-09 NOTE — Patient Instructions (Signed)
Mammogram/US done  Take steroids MRI of spine  F/U pending results

## 2015-02-09 NOTE — Telephone Encounter (Signed)
She had 2 scripts sent already Prednisone and muscle relaxer for now these should help the pain

## 2015-02-10 ENCOUNTER — Other Ambulatory Visit: Payer: Self-pay

## 2015-02-11 NOTE — Telephone Encounter (Signed)
Call placed to patient grandmother and Bethena Roys made aware.

## 2015-02-15 ENCOUNTER — Other Ambulatory Visit: Payer: Self-pay | Admitting: Family Medicine

## 2015-02-15 ENCOUNTER — Ambulatory Visit
Admission: RE | Admit: 2015-02-15 | Discharge: 2015-02-15 | Disposition: A | Discharge status: Home / Self Care | Payer: 59 | Source: Ambulatory Visit | Attending: Family Medicine | Admitting: Family Medicine

## 2015-02-15 ENCOUNTER — Other Ambulatory Visit (HOSPITAL_COMMUNITY): Payer: Self-pay

## 2015-02-15 DIAGNOSIS — N6320 Unspecified lump in the left breast, unspecified quadrant: Secondary | ICD-10-CM

## 2015-02-25 ENCOUNTER — Other Ambulatory Visit: Payer: Self-pay | Admitting: Family Medicine

## 2015-02-25 ENCOUNTER — Ambulatory Visit
Admission: RE | Admit: 2015-02-25 | Discharge: 2015-02-25 | Disposition: A | Discharge status: Home / Self Care | Payer: 59 | Source: Ambulatory Visit | Attending: Family Medicine | Admitting: Family Medicine

## 2015-02-25 ENCOUNTER — Ambulatory Visit (HOSPITAL_COMMUNITY): Payer: 59

## 2015-02-25 DIAGNOSIS — N6320 Unspecified lump in the left breast, unspecified quadrant: Secondary | ICD-10-CM

## 2015-03-04 ENCOUNTER — Encounter: Payer: Self-pay | Admitting: *Deleted

## 2015-09-16 ENCOUNTER — Ambulatory Visit (INDEPENDENT_AMBULATORY_CARE_PROVIDER_SITE_OTHER): Payer: 59

## 2015-09-16 DIAGNOSIS — Z23 Encounter for immunization: Secondary | ICD-10-CM | POA: Diagnosis not present

## 2015-09-16 NOTE — Progress Notes (Signed)
Patient came in for HPV 9/8 @ 2:23 PM in the left deltoid  Vaccine was given EPIC would not accept the NDC# NDC#[id number] [id number] EXP-11-23-17

## 2015-09-19 NOTE — Addendum Note (Signed)
Addended by: Vonna Kotyk A on: 09/19/2015 03:49 PM   Modules accepted: Orders

## 2016-05-28 ENCOUNTER — Ambulatory Visit: Payer: 59 | Admitting: Family Medicine

## 2016-06-22 ENCOUNTER — Encounter: Payer: Self-pay | Admitting: Obstetrics and Gynecology

## 2016-06-22 ENCOUNTER — Ambulatory Visit (INDEPENDENT_AMBULATORY_CARE_PROVIDER_SITE_OTHER): Payer: 59 | Admitting: Obstetrics and Gynecology

## 2016-06-22 ENCOUNTER — Other Ambulatory Visit (HOSPITAL_COMMUNITY)
Admission: RE | Admit: 2016-06-22 | Discharge: 2016-06-22 | Disposition: A | Discharge status: Home / Self Care | Payer: Managed Care, Other (non HMO) | Source: Ambulatory Visit | Attending: Obstetrics and Gynecology | Admitting: Obstetrics and Gynecology

## 2016-06-22 VITALS — BP 90/60 | HR 96 | Ht 62.0 in | Wt 91.0 lb

## 2016-06-22 DIAGNOSIS — N814 Uterovaginal prolapse, unspecified: Secondary | ICD-10-CM | POA: Diagnosis not present

## 2016-06-22 DIAGNOSIS — N6320 Unspecified lump in the left breast, unspecified quadrant: Secondary | ICD-10-CM | POA: Diagnosis not present

## 2016-06-22 DIAGNOSIS — Z01419 Encounter for gynecological examination (general) (routine) without abnormal findings: Secondary | ICD-10-CM | POA: Insufficient documentation

## 2016-06-22 NOTE — Progress Notes (Signed)
  Assessment:  Annual Gyn Exam  pelvic relaxation in nulligravida Left breast fibroadenoma Plan:  1. pap smear done, next pap due in 3 years 2. return annually or prn 3    Annual mammogram advised Subjective:  Sandra Mueller is a 26 y.o. female No obstetric history on file. who presents for annual exam. [name] last menstrual period was 06/12/2016 (exact date). The patient has no acute complaints at this time. Her last pap smear was in 2014 and was normal. She also notes left breast mass which she has had a negative biopsy for in the past.   The following portions of the patient's history were reviewed and updated as appropriate: allergies, current medications, past family history, past medical history, past social history, past surgical history and problem list. Past Medical History:  Diagnosis Date  . Scoliosis     History reviewed. No pertinent surgical history.   Current Outpatient Prescriptions:  .  ibuprofen (ADVIL,MOTRIN) 200 MG tablet, Take 200 mg by mouth every 6 (six) hours as needed., Disp: , Rfl:  .  methocarbamol (ROBAXIN) 500 MG tablet, Take 1 tablet (500 mg total) by mouth 2 (two) times daily as needed for muscle spasms., Disp: 60 tablet, Rfl: 1  Review of Systems Otherwise negative for acute change except as noted in the HPI.   Objective:  BP 90/60 (BP Location: Right Arm, Patient Position: Sitting, Cuff Size: Normal)   Pulse 96   Ht 5\' 2"  (1.575 m)   Wt 91 lb (41.3 kg)   LMP 06/12/2016 (Exact Date)   BMI 16.64 kg/m    BMI: Body mass index is 16.64 kg/m.  General Appearance: Alert, appropriate appearance for age. No acute distress HEENT: Grossly normal Neck / Thyroid:  Cardiovascular: RRR; normal S1, S2, no murmur Lungs: CTA bilaterally Back: No CVAT Breast Exam: 3cm x 3cm firm, mobile, nodule in left breast at 3 o'clock, bilobed; right breast is normal Gastrointestinal: Soft, non-tender, no masses or organomegaly Pelvic Exam: External genitalia: normal  general appearance Vaginal: normal mucosa without prolapse or lesions and normal without tenderness, induration or masses short vaginal length. Cervix: normal appearance Uterus: retroverted uterus small , with second degree descensus and retroversion , significantly more than ususal for a nulligravid female. Pt left ovary normal size but distinctly palpable Pap smear done  Rectovaginal: not indicated Lymphatic Exam: Non-palpable nodes in neck, clavicular, axillary, or inguinal regions  Skin: no rash or abnormalities Neurologic: Normal gait and speech, no tremor  Psychiatric: Alert and oriented, appropriate affect.  Urinalysis:Not done   By signing my name below, I, Evelene Croon, attest that this documentation has been prepared under the direction and in the presence of Jonnie Kind, MD . Electronically Signed: Evelene Croon, Scribe. 06/22/2016. 11:07 AM. I personally performed the services described in this documentation, which was SCRIBED in my presence. The recorded information has been reviewed and considered accurate. It has been edited as necessary during review. Jonnie Kind, MD

## 2016-06-26 LAB — CYTOLOGY - PAP
CHLAMYDIA, DNA PROBE: NEGATIVE
DIAGNOSIS: NEGATIVE
NEISSERIA GONORRHEA: NEGATIVE

## 2016-12-23 ENCOUNTER — Emergency Department (HOSPITAL_COMMUNITY)
Admission: EM | Admit: 2016-12-23 | Discharge: 2016-12-23 | Disposition: A | Discharge status: Home / Self Care | Payer: 59 | Attending: Emergency Medicine | Admitting: Emergency Medicine

## 2016-12-23 ENCOUNTER — Other Ambulatory Visit: Payer: Self-pay

## 2016-12-23 ENCOUNTER — Encounter (HOSPITAL_COMMUNITY): Payer: Self-pay | Admitting: Emergency Medicine

## 2016-12-23 DIAGNOSIS — M5441 Lumbago with sciatica, right side: Secondary | ICD-10-CM | POA: Insufficient documentation

## 2016-12-23 DIAGNOSIS — F17210 Nicotine dependence, cigarettes, uncomplicated: Secondary | ICD-10-CM | POA: Insufficient documentation

## 2016-12-23 DIAGNOSIS — M545 Low back pain: Secondary | ICD-10-CM | POA: Diagnosis present

## 2016-12-23 MED ORDER — PREDNISONE 20 MG PO TABS: ORAL_TABLET | ORAL | 0 refills | Status: DC

## 2016-12-23 MED ORDER — KETOROLAC TROMETHAMINE 30 MG/ML IJ SOLN
30.0000 mg | Freq: Once | INTRAMUSCULAR | Status: AC
Start: 2016-12-23 — End: 2016-12-23
  Administered 2016-12-23: 30 mg via INTRAMUSCULAR
  Filled 2016-12-23: qty 1

## 2016-12-23 MED ORDER — METHOCARBAMOL 500 MG PO TABS: ORAL_TABLET | ORAL | 0 refills | Status: DC

## 2016-12-23 NOTE — Discharge Instructions (Signed)
Use ice and heat for comfort. Take the medications as prescribed. Follow up with your PCP about seeing a specialist for your back.

## 2016-12-23 NOTE — ED Provider Notes (Signed)
[facility] EMERGENCY DEPARTMENT Provider Note   CSN: [id number] Arrival date & time: 12/23/16  0259  Time seen 03:40 AM   History   Chief Complaint Chief Complaint  Patient presents with  . Back Pain    HPI Sandra Mueller is a 26 y.o. female.  HPI when patient was asked when her back pain started she states "always".  She states it comes and goes or gets better and worse.  She states it got worse tonight.  She states the pain is in her lower back.  She also states she has some numbness on her anterior right thigh that has been present for years and usually occurs when she lays down at bed at night.  She states it has been getting more persistent the last few nights.  She states she is found sleeping with a pillow between her knees or with a pillow under her back helps with the numbness in her leg.  She denies any fever or urinary or rectal incontinence.  She states lifting and moving quickly makes the pain worse, heat makes it feel better in addition to using the pillow as discussed before.  She states she sees her doctor every few months for this pain.  She states she has been on muscle relaxers  which have helped a little bit.  PCP Orlena Sheldon PA-C    Past Medical History:  Diagnosis Date  . Scoliosis     Patient Active Problem List   Diagnosis Date Noted  . Well woman exam with routine gynecological exam 06/22/2016  . Pelvic relaxation due to uterine prolapse 06/22/2016  . Breast mass, left 12/01/2012    History reviewed. No pertinent surgical history.  OB History    No data available       Home Medications    Prior to Admission medications   Medication Sig Start Date End Date Taking? Authorizing Provider  ibuprofen (ADVIL,MOTRIN) 200 MG tablet Take 200 mg by mouth every 6 (six) hours as needed.    [provider]  methocarbamol (ROBAXIN) 500 MG tablet Take 1 or 2 po Q 6hrs for pain 12/23/16   Rolland Porter, MD  predniSONE (DELTASONE) 20 MG tablet Take  2 po QD x 4d then 1 po QD x 4d 12/23/16   Rolland Porter, MD    Family History Family History  Problem Relation Age of Onset  . Hypertension Mother   . Scoliosis Maternal Grandmother   . Hypertension Maternal Grandmother     Social History Social History   Tobacco Use  . Smoking status: Current Every Day Smoker    Packs/day: 0.00    Types: Cigarettes  . Smokeless tobacco: Never Used  . Tobacco comment: 2 cigs per day  Substance Use Topics  . Alcohol use: No    Comment: Occasional  . Drug use: No  employed   Allergies   Patient has no known allergies.   Review of Systems Review of Systems  All other systems reviewed and are negative.    Physical Exam Updated Vital Signs BP (!) 141/60 (BP Location: Right Arm)   Pulse 86   Temp 98.6 F (37 C) (Oral)   Resp 16   Ht 5\' 2"  (1.575 m)   Wt 46.3 kg (102 lb)   SpO2 100%   BMI 18.66 kg/m   Vital signs normal    Physical Exam  Constitutional: She appears well-developed and well-nourished. No distress.  HENT:  Head: Normocephalic and atraumatic.  Right Ear: External  ear normal.  Left Ear: External ear normal.  Nose: Nose normal.  Eyes: Conjunctivae and EOM are normal.  Neck: Normal range of motion.  Cardiovascular: Regular rhythm.  Pulmonary/Chest: Effort normal. No respiratory distress.  Musculoskeletal: She exhibits tenderness. She exhibits no edema or deformity.       Back:  Patient does not have pain on flexion of the lumbar spine in any direction, she does not have pain with straight leg raising.  Her patellar reflexes are 1+ and equal bilaterally  Nursing note and vitals reviewed.    ED Treatments / Results  Labs (all labs ordered are listed, but only abnormal results are displayed) Labs Reviewed - No data to display  EKG  EKG Interpretation None       Radiology No results found.  Procedures Procedures (including critical care time)  Medications Ordered in ED Medications  ketorolac  (TORADOL) 30 MG/ML injection 30 mg (30 mg Intramuscular Given 12/23/16 0426)     Initial Impression / Assessment and Plan / ED Course  I have reviewed the triage vital signs and the nursing notes.  Pertinent labs & imaging results that were available during my care of the patient were reviewed by me and considered in my medical decision making (see chart for details).     Patient was given Toradol IM for pain.  We discussed following up with her PCP and asking for referral to a specialist to evaluate her back further, she is very young to be having back problems.  She has been coming to the ED for several years for low back pain, her last x-rays were in 2012 and 2013 which were normal.  She had a MRI of her lumbar spine in 2008 which showed a lumbar scoliosis with a rotary component and a borderline disc bulge at L4-L5 without nerve root impingement.  At that time she had been seeing Dr. Aline Brochure, orthopedist.  Final Clinical Impressions(s) / ED Diagnoses   Final diagnoses:  Midline low back pain with right-sided sciatica, unspecified chronicity    ED Discharge Orders        Ordered    predniSONE (DELTASONE) 20 MG tablet     12/23/16 0456    methocarbamol (ROBAXIN) 500 MG tablet     12/23/16 0456      Plan discharge  Rolland Porter, MD, Barbette Or, MD 12/23/16 [id number]

## 2016-12-23 NOTE — ED Triage Notes (Signed)
Pt states she has had chronic back lower back pain with a "tilted pelvis" Pt states she has lower back pain on a scale of 0 to 10 rating it a 10. Pt denies problems urinating.

## 2019-01-14 ENCOUNTER — Ambulatory Visit: Payer: 59 | Attending: Internal Medicine

## 2019-01-14 ENCOUNTER — Other Ambulatory Visit: Payer: Self-pay

## 2019-01-14 DIAGNOSIS — Z20822 Contact with and (suspected) exposure to covid-19: Secondary | ICD-10-CM

## 2019-01-16 LAB — NOVEL CORONAVIRUS, NAA: SARS-CoV-2, NAA: NOT DETECTED

## 2019-07-27 ENCOUNTER — Encounter: Payer: Self-pay | Admitting: Obstetrics and Gynecology

## 2019-07-27 ENCOUNTER — Other Ambulatory Visit (HOSPITAL_COMMUNITY)
Admission: RE | Admit: 2019-07-27 | Discharge: 2019-07-27 | Disposition: A | Discharge status: Home / Self Care | Payer: BC Managed Care – PPO | Source: Ambulatory Visit | Attending: Obstetrics and Gynecology | Admitting: Obstetrics and Gynecology

## 2019-07-27 ENCOUNTER — Ambulatory Visit (INDEPENDENT_AMBULATORY_CARE_PROVIDER_SITE_OTHER): Payer: BC Managed Care – PPO | Admitting: Obstetrics and Gynecology

## 2019-07-27 VITALS — BP 119/86 | HR 89 | Ht 64.5 in | Wt 88.0 lb

## 2019-07-27 DIAGNOSIS — Z01419 Encounter for gynecological examination (general) (routine) without abnormal findings: Secondary | ICD-10-CM

## 2019-07-27 DIAGNOSIS — D242 Benign neoplasm of left breast: Secondary | ICD-10-CM

## 2019-07-27 NOTE — Progress Notes (Signed)
Patient ID: Sandra Mueller, female   DOB: 07/06/1990, 29 y.o.   MRN: [id number]   Assessment:  Annual Gyn Exam  Plan:  1. Pap smear done, next pap due 3 years 2. Return annually or prn 3    Annual mammogram advised after age 90 Subjective:  Sandra Mueller is a 29 y.o. female No obstetric history on file. who presents for annual exam. [name] last menstrual period was 07/10/2019. The patient reports that she has always had a knot in her left breast. She had the site biopsied in 2017 and it showed a fibroadenoma with no evidence of malignancy. She notes that the mass changes in size throughout her cycle. She performs regular breast self-exams.  The patient is not on any birth control at this time. She has no problems with her periods. The patient is in a relationship with a female partner.  The following portions of the patient's history were reviewed and updated as appropriate: allergies, current medications, past family history, past medical history, past social history, past surgical history and problem list. Past Medical History:  Diagnosis Date   Scoliosis     History reviewed. No pertinent surgical history.  No current outpatient medications on file.  Review of Systems Constitutional: negative Gastrointestinal: negative Genitourinary: negative  Objective:  BP 119/86 (BP Location: Right Arm, Patient Position: Sitting, Cuff Size: Normal)    Pulse 89    Ht 5' 4.5" (1.638 m)    Wt 88 lb (39.9 kg)    LMP 07/10/2019    BMI 14.87 kg/m    BMI: Body mass index is 14.87 kg/m.  General Appearance: Alert, appropriate appearance for age. No acute distress HEENT: Grossly normal Neck / Thyroid:  Cardiovascular: RRR; normal S1, S2, no murmur Lungs: CTA bilaterally Back: No CVAT Breast Exam: 2x2x1.5 cm mobile fibroadenoma on the left outer breast. No dimpling, nipple retraction or discharge. Gastrointestinal: Soft, non-tender, no masses or organomegaly Pelvic Exam: Vulva and vagina  appear normal. Bimanual exam reveals normal uterus and adnexa. Rectovaginal: not indicated Lymphatic Exam: Non-palpable nodes in neck, clavicular, axillary, or inguinal regions  Skin: no rash or abnormalities Neurologic: Normal gait and speech, no tremor  Psychiatric: Alert and oriented, appropriate affect.  Urinalysis:Not done  Mallory Shirk. MD Pgr [phone] 10:29 AM  By signing my name below, I, De Burrs, attest that this documentation has been prepared under the direction and in the presence of Jonnie Kind, MD. Electronically Signed: De Burrs, Medical Scribe. 07/27/19. 10:29 AM.  I personally performed the services described in this documentation, which was SCRIBED in my presence. The recorded information has been reviewed and considered accurate. It has been edited as necessary during review. Jonnie Kind, MD

## 2019-07-31 LAB — CYTOLOGY - PAP
Chlamydia: NEGATIVE
Comment: NEGATIVE
Comment: NEGATIVE
Comment: NEGATIVE
Comment: NEGATIVE
Comment: NORMAL
Diagnosis: NEGATIVE
HPV 16: NEGATIVE
HPV 18 / 45: NEGATIVE
High risk HPV: POSITIVE — AB
Neisseria Gonorrhea: NEGATIVE

## 2019-08-02 NOTE — Progress Notes (Signed)
HPV positive but only low risk subtype. Repeat pap 3 yrs. Very low risk for severe abnormalities in the next 5 yrs.

## 2020-09-26 ENCOUNTER — Encounter: Payer: Self-pay | Admitting: Women's Health

## 2020-09-26 ENCOUNTER — Other Ambulatory Visit (HOSPITAL_COMMUNITY)
Admission: RE | Admit: 2020-09-26 | Discharge: 2020-09-26 | Disposition: A | Discharge status: Home / Self Care | Payer: BC Managed Care – PPO | Source: Ambulatory Visit | Attending: Women's Health | Admitting: Women's Health

## 2020-09-26 ENCOUNTER — Other Ambulatory Visit: Payer: Self-pay

## 2020-09-26 ENCOUNTER — Ambulatory Visit (INDEPENDENT_AMBULATORY_CARE_PROVIDER_SITE_OTHER): Payer: BC Managed Care – PPO | Admitting: Women's Health

## 2020-09-26 VITALS — BP 124/85 | HR 90 | Ht 62.0 in | Wt 91.0 lb

## 2020-09-26 DIAGNOSIS — Z13220 Encounter for screening for lipoid disorders: Secondary | ICD-10-CM | POA: Diagnosis not present

## 2020-09-26 DIAGNOSIS — R87619 Unspecified abnormal cytological findings in specimens from cervix uteri: Secondary | ICD-10-CM | POA: Insufficient documentation

## 2020-09-26 DIAGNOSIS — Z01419 Encounter for gynecological examination (general) (routine) without abnormal findings: Secondary | ICD-10-CM | POA: Insufficient documentation

## 2020-09-26 DIAGNOSIS — Z131 Encounter for screening for diabetes mellitus: Secondary | ICD-10-CM

## 2020-09-26 DIAGNOSIS — Z113 Encounter for screening for infections with a predominantly sexual mode of transmission: Secondary | ICD-10-CM

## 2020-09-26 DIAGNOSIS — Z1329 Encounter for screening for other suspected endocrine disorder: Secondary | ICD-10-CM

## 2020-09-26 DIAGNOSIS — R7989 Other specified abnormal findings of blood chemistry: Secondary | ICD-10-CM

## 2020-09-26 DIAGNOSIS — N6320 Unspecified lump in the left breast, unspecified quadrant: Secondary | ICD-10-CM

## 2020-09-26 NOTE — Progress Notes (Signed)
WELL-WOMAN EXAMINATION Patient name: Sandra Mueller MRN [id number]  Date of birth: 1990-03-18 Chief Complaint:   Gynecologic Exam (Pap/physcial/ last pap 07-27-19 +hpv)  History of Present Illness:   Sandra Mueller is a 30 y.o. G0P0000 African-American female being seen today for a routine well-woman exam.  Current complaints: none, needs physical form filled out & labs for work. Not fasting today.  PCP: Jonni Sanger [facility]      does desire labs including STD screening [name] last menstrual period was 09/17/2020. The current method of family planning is  homosexual relationship .  Last pap 07/27/19. Results were: NILM w/ HRHPV positive: other (not 16, 18/45). H/O abnormal pap: no Last mammogram: never, had breast u/s 2014, 2017. Results were: 3cm fibroadenoma confirmed by bx, denies any changes   Family h/o breast cancer: no Last colonoscopy: never. Results were: N/A. Family h/o colorectal cancer: no  Depression screen [facility] 2/9 09/26/2020 07/27/2019  Decreased Interest 0 0  Down, Depressed, Hopeless 0 0  PHQ - 2 Score 0 0  Altered sleeping 0 0  Tired, decreased energy 0 0  Change in appetite 0 0  Feeling bad or failure about yourself  0 0  Trouble concentrating 0 0  Moving slowly or fidgety/restless 0 0  Suicidal thoughts 0 0  PHQ-9 Score 0 0     GAD 7 : Generalized Anxiety Score 09/26/2020 07/27/2019  Nervous, Anxious, on Edge 0 0  Control/stop worrying 0 0  Worry too much - different things 0 0  Trouble relaxing 0 0  Restless 0 0  Easily annoyed or irritable 0 0  Afraid - awful might happen 0 0  Total GAD 7 Score 0 0     Review of Systems:   Pertinent items are noted in HPI Denies any headaches, blurred vision, fatigue, shortness of breath, chest pain, abdominal pain, abnormal vaginal discharge/itching/odor/irritation, problems with periods, bowel movements, urination, or intercourse unless otherwise stated above. Pertinent History Reviewed:  Reviewed past medical,surgical,  social and family history.  Reviewed problem list, medications and allergies. Physical Assessment:   Vitals:   09/26/20 1322  BP: 124/85  Pulse: 90  Weight: 91 lb (41.3 kg)  Height: 5\' 2"  (1.575 m)  Body mass index is 16.64 kg/m.        Physical Examination:   General appearance - well appearing, and in no distress  Mental status - alert, oriented to person, place, and time  Psych:  She has a normal mood and affect  Skin - warm and dry, normal color, no suspicious lesions noted  Chest - effort normal, all lung fields clear to auscultation bilaterally  Heart - normal rate and regular rhythm  Neck:  midline trachea, no thyromegaly or nodules  Breasts - Rt breasts appear normal, no suspicious masses, no skin or nipple changes or axillary nodes; Lt breast w/ 3cm firm mobile mass, 82fb from nipple @ 3 o'clock c/w known fibroadenoma  Abdomen - soft, nontender, nondistended, no masses or organomegaly  Pelvic - VULVA: normal appearing vulva with no masses, tenderness or lesions  VAGINA: normal appearing vagina with normal color and discharge, no lesions  CERVIX: normal appearing cervix without discharge or lesions, no CMT  Thin prep pap is done w/ HR HPV cotesting  UTERUS: uterus is felt to be normal size, shape, consistency and nontender   ADNEXA: No adnexal masses or tenderness noted.  Extremities:  No swelling or varicosities noted  Chaperone: Peggy Dones    No results found for this  or any previous visit (from the past 24 hour(s)).  Assessment & Plan:  1) Well-Woman Exam  2) Known 3cm fibroadenoma Lt breast> no changes, no further f/u needed  3) STD screen  4) H/O +HRHPV on pap> repeat today  Labs/procedures today: as below  Mammogram: @ 30yo, or sooner if problems Colonoscopy: @ 30yo, or sooner if problems  Orders Placed This Encounter  Procedures   Lipid panel   Glucose, fasting   CBC   Comprehensive metabolic panel   TSH   VITAMIN D 25 Hydroxy (Vit-D Deficiency,  Fractures)   HIV Antibody (routine testing w rflx)   RPR    Meds: No orders of the defined types were placed in this encounter.   Follow-up: Return for tomorrow am for fasting labs; then 19yr for physical.  Roma Schanz CNM, [facility] 09/26/2020 1:57 PM

## 2020-09-27 ENCOUNTER — Other Ambulatory Visit: Payer: BC Managed Care – PPO

## 2020-09-27 DIAGNOSIS — Z13220 Encounter for screening for lipoid disorders: Secondary | ICD-10-CM | POA: Diagnosis not present

## 2020-09-27 DIAGNOSIS — Z113 Encounter for screening for infections with a predominantly sexual mode of transmission: Secondary | ICD-10-CM | POA: Diagnosis not present

## 2020-09-27 DIAGNOSIS — Z1329 Encounter for screening for other suspected endocrine disorder: Secondary | ICD-10-CM | POA: Diagnosis not present

## 2020-09-27 DIAGNOSIS — Z01419 Encounter for gynecological examination (general) (routine) without abnormal findings: Secondary | ICD-10-CM | POA: Diagnosis not present

## 2020-09-28 LAB — LIPID PANEL
Chol/HDL Ratio: 1.8 ratio (ref 0.0–4.4)
Cholesterol, Total: 160 mg/dL (ref 100–199)
HDL: 87 mg/dL (ref >39)
LDL Chol Calc (NIH): 59 mg/dL (ref 0–99)
Triglycerides: 70 mg/dL (ref 0–149)
VLDL Cholesterol Cal: 14 mg/dL (ref 5–40)

## 2020-09-28 LAB — COMPREHENSIVE METABOLIC PANEL
ALT: 8 IU/L (ref 0–32)
AST: 15 IU/L (ref 0–40)
Albumin/Globulin Ratio: 1.9 (ref 1.2–2.2)
Albumin: 4.4 g/dL (ref 3.9–5.0)
Alkaline Phosphatase: 56 IU/L (ref 44–121)
BUN/Creatinine Ratio: 15 (ref 9–23)
BUN: 14 mg/dL (ref 6–20)
Bilirubin Total: 0.6 mg/dL (ref 0.0–1.2)
CO2: 23 mmol/L (ref 20–29)
Calcium: 9.5 mg/dL (ref 8.7–10.2)
Chloride: 103 mmol/L (ref 96–106)
Creatinine, Ser: 0.95 mg/dL (ref 0.57–1.00)
Globulin, Total: 2.3 g/dL (ref 1.5–4.5)
Glucose: 97 mg/dL (ref 65–99)
Potassium: 4.4 mmol/L (ref 3.5–5.2)
Sodium: 141 mmol/L (ref 134–144)
Total Protein: 6.7 g/dL (ref 6.0–8.5)
eGFR: 83 mL/min/{1.73_m2} (ref >59)

## 2020-09-28 LAB — CBC
Hematocrit: 35.6 % (ref 34.0–46.6)
Hemoglobin: 10.7 g/dL — ABNORMAL LOW (ref 11.1–15.9)
MCH: 26.4 pg — ABNORMAL LOW (ref 26.6–33.0)
MCHC: 30.1 g/dL — ABNORMAL LOW (ref 31.5–35.7)
MCV: 88 fL (ref 79–97)
Platelets: 431 10*3/uL (ref 150–450)
RBC: 4.05 x10E6/uL (ref 3.77–5.28)
RDW: 14.3 % (ref 11.7–15.4)
WBC: 6.6 10*3/uL (ref 3.4–10.8)

## 2020-09-28 LAB — HIV ANTIBODY (ROUTINE TESTING W REFLEX): HIV Screen 4th Generation wRfx: NONREACTIVE

## 2020-09-28 LAB — TSH: TSH: 4.53 u[IU]/mL — ABNORMAL HIGH (ref 0.450–4.500)

## 2020-09-28 LAB — VITAMIN D 25 HYDROXY (VIT D DEFICIENCY, FRACTURES): Vit D, 25-Hydroxy: 32 ng/mL (ref 30.0–100.0)

## 2020-09-28 LAB — RPR: RPR Ser Ql: NONREACTIVE

## 2020-09-29 LAB — CYTOLOGY - PAP
Chlamydia: NEGATIVE
Comment: NEGATIVE
Comment: NEGATIVE
Comment: NORMAL
Diagnosis: UNDETERMINED — AB
High risk HPV: NEGATIVE
Neisseria Gonorrhea: NEGATIVE

## 2020-10-03 MED ORDER — FERROUS SULFATE 325 (65 FE) MG PO TABS: 325.0000 mg | ORAL_TABLET | ORAL | 6 refills | Status: DC

## 2020-10-03 NOTE — Addendum Note (Signed)
Addended by: Wells Guiles R on: 10/03/2020 11:54 AM   Modules accepted: Orders

## 2020-11-15 DIAGNOSIS — R059 Cough, unspecified: Secondary | ICD-10-CM | POA: Diagnosis not present

## 2020-12-15 ENCOUNTER — Encounter: Payer: Self-pay | Admitting: Emergency Medicine

## 2020-12-15 ENCOUNTER — Other Ambulatory Visit: Payer: Self-pay

## 2020-12-15 ENCOUNTER — Ambulatory Visit
Admission: EM | Admit: 2020-12-15 | Discharge: 2020-12-15 | Disposition: A | Discharge status: Home / Self Care | Payer: BC Managed Care – PPO | Attending: Emergency Medicine | Admitting: Emergency Medicine

## 2020-12-15 ENCOUNTER — Ambulatory Visit (INDEPENDENT_AMBULATORY_CARE_PROVIDER_SITE_OTHER): Payer: BC Managed Care – PPO

## 2020-12-15 DIAGNOSIS — M79672 Pain in left foot: Secondary | ICD-10-CM | POA: Diagnosis not present

## 2020-12-15 DIAGNOSIS — S9032XA Contusion of left foot, initial encounter: Secondary | ICD-10-CM

## 2020-12-15 DIAGNOSIS — S99922A Unspecified injury of left foot, initial encounter: Secondary | ICD-10-CM | POA: Diagnosis not present

## 2020-12-15 MED ORDER — IBUPROFEN 400 MG PO TABS: 400.0000 mg | ORAL_TABLET | Freq: Four times a day (QID) | ORAL | 0 refills | Status: AC | PRN

## 2020-12-15 NOTE — Discharge Instructions (Addendum)
Wear the stiff soled shoe as needed for comfort.  500 mg of Tylenol combined with 400 mg of ibuprofen together 3-4 times a day as needed, ice, elevation, rest, Epsom salt soaks.  Follow-up with podiatry if not better in 7 to 10 days for reevaluation and possible reimaging.

## 2020-12-15 NOTE — ED Provider Notes (Signed)
HPI  SUBJECTIVE:  Sandra Mueller is a 30 y.o. female who presents with left medial foot pain after dropping a heavy box on it yesterday while at work.  No immediate pain.  States that the pain started later.  States that she was wearing soft shoes.  She reports swelling, bruising, limitation of motion of the first toe.  No distal numbness or tingling.  She tried ice, soaking in Epsom salts without improvement in her symptoms.  Symptoms are worse with weightbearing past medical history negative for left foot injury.  LMP: 11/1.  Denies the possibility of being pregnant.  PMD: Orlena Sheldon, PA-C  She is not sure if this is a Designer, jewellery case.  Past Medical History:  Diagnosis Date   Scoliosis     History reviewed. No pertinent surgical history.  Family History  Problem Relation Age of Onset   Hypertension Mother    Scoliosis Maternal Grandmother    Hypertension Maternal Grandmother     Social History   Tobacco Use   Smoking status: Former    Packs/day: 0.00    Types: Cigarettes   Smokeless tobacco: Never   Tobacco comments:    2 cigs per day  Vaping Use   Vaping Use: Never used  Substance Use Topics   Alcohol use: No    Comment: Occasional   Drug use: No    No current facility-administered medications for this encounter.  Current Outpatient Medications:    ibuprofen (ADVIL) 400 MG tablet, Take 1 tablet (400 mg total) by mouth every 6 (six) hours as needed., Disp: 30 tablet, Rfl: 0  No Known Allergies   ROS  As noted in HPI.   Physical Exam  BP 115/62 (BP Location: Right Arm)   Pulse 88   Temp 98.1 F (36.7 C) (Oral)   Resp 18   LMP 12/08/2020   SpO2 98%   Constitutional: Well developed, well nourished, no acute distress Eyes:  EOMI, conjunctiva normal bilaterally HENT: Normocephalic, atraumatic,mucus membranes moist Respiratory: Normal inspiratory effort Cardiovascular: Normal rate GI: nondistended skin: No rash, skin intact Musculoskeletal:  Left first metatarsal, MTP tender.  Left midfoot NT. Base of fifth metatarsal NT. No bruising. Skin intact. DP 2+. Refill less than 2 seconds. Sensation grossly intact. Patient able to move all toes actively.  no pain with inversion / eversion,  dorsiflexion / plantarflexion. No Tenderness along the plantar fascia. Distal fibula NT, Medial malleolus NT,  Deltoid ligament NT, Lateral ligaments NT, Achilles NT. Patient able to bear weight while in department.  Neurologic: Alert & oriented x 3, no focal neuro deficits Psychiatric: Speech and behavior appropriate   ED Course   Medications - No data to display  Orders Placed This Encounter  Procedures   DG Foot Complete Left    Standing Status:   Standing    Number of Occurrences:   1    Order Specific Question:   Reason for Exam (SYMPTOM  OR DIAGNOSIS REQUIRED)    Answer:   dropped bin on left foot.  swollen, painful foot   Apply Post op shoe    Standing Status:   Standing    Number of Occurrences:   1    Order Specific Question:   Laterality    Answer:   Left    No results found for this or any previous visit (from the past 24 hour(s)). DG Foot Complete Left  Result Date: 12/15/2020 CLINICAL DATA:  Pain post blunt trauma EXAM: LEFT FOOT - COMPLETE  3+ VIEW COMPARISON:  None. FINDINGS: There is no evidence of fracture or dislocation. There is no evidence of arthropathy or other focal bone abnormality. Soft tissues are unremarkable. IMPRESSION: Negative. Electronically Signed   By: Lucrezia Europe M.D.   On: 12/15/2020 18:03    ED Clinical Impression  1. Contusion of left foot, initial encounter      ED Assessment/Plan   Reviewed imaging independently.  No fracture.  See radiology report for full details.  Patient has tenderness along the first metatarsal, otherwise, rest of the foot exam is normal.  Presentation most consistent with a contusion of the foot, however, She may have a stress fracture that has not been picked up on x-ray.   Placing here for a stiff soled shoe.  Home with Tylenol/ibuprofen, ice, elevation, rest, Epsom salt soaks.  Follow-up with podiatry if not better in 7 to 10 days for reevaluation and possible reimaging  Work note.  She is working Friday, Saturday.  Return on Sunday.  Discussed imaging, MDM, treatment plan, and plan for follow-up with patient. patient agrees with plan.   Meds ordered this encounter  Medications   ibuprofen (ADVIL) 400 MG tablet    Sig: Take 1 tablet (400 mg total) by mouth every 6 (six) hours as needed.    Dispense:  30 tablet    Refill:  0      *This clinic note was created using [employer]. Therefore, there may be occasional mistakes despite careful proofreading.  ?    Melynda Ripple, MD 12/16/20 [phone]

## 2020-12-15 NOTE — ED Triage Notes (Signed)
Left foot pain and swelling, dropped a bin on foot yesterday  Pain in foot when attempting to raise or move great toe in any way.  Visible swelling

## 2021-10-06 ENCOUNTER — Ambulatory Visit (INDEPENDENT_AMBULATORY_CARE_PROVIDER_SITE_OTHER): Payer: BC Managed Care – PPO | Admitting: Obstetrics & Gynecology

## 2021-10-06 ENCOUNTER — Other Ambulatory Visit (HOSPITAL_COMMUNITY)
Admission: RE | Admit: 2021-10-06 | Discharge: 2021-10-06 | Disposition: A | Discharge status: Home / Self Care | Payer: BC Managed Care – PPO | Source: Ambulatory Visit | Attending: Obstetrics & Gynecology | Admitting: Obstetrics & Gynecology

## 2021-10-06 ENCOUNTER — Encounter: Payer: Self-pay | Admitting: Obstetrics & Gynecology

## 2021-10-06 VITALS — BP 102/67 | HR 76 | Ht 62.0 in | Wt 92.2 lb

## 2021-10-06 DIAGNOSIS — Z01419 Encounter for gynecological examination (general) (routine) without abnormal findings: Secondary | ICD-10-CM | POA: Diagnosis not present

## 2021-10-06 DIAGNOSIS — D500 Iron deficiency anemia secondary to blood loss (chronic): Secondary | ICD-10-CM

## 2021-10-06 DIAGNOSIS — Z13220 Encounter for screening for lipoid disorders: Secondary | ICD-10-CM

## 2021-10-06 NOTE — Progress Notes (Signed)
WELL-WOMAN EXAMINATION Patient name: Sandra Mueller MRN [id number]  Date of birth: 12-10-1990 Chief Complaint:   Annual Exam  History of Present Illness:   Sandra Mueller is a 31 y.o. G0P0000 female being seen today for a routine well-woman exam.  Today she notes no acute complaints or concerns   [name] last menstrual period was 09/16/2021. Menses mostly regular each month- with heavy days for the past 3-4 days.  Changes pad/tampon every 3 hours.  The current method of family planning is  n/a- pt prefers female partners .    Last pap 2022- ASCUS, HPV neg.  Last mammogram: n/a. Last colonoscopy: n/a     10/06/2021    8:35 AM 09/26/2020    1:33 PM 07/27/2019   10:00 AM  Depression screen PHQ 2/9  Decreased Interest 0 0 0  Down, Depressed, Hopeless 0 0 0  PHQ - 2 Score 0 0 0  Altered sleeping 0 0 0  Tired, decreased energy 0 0 0  Change in appetite 0 0 0  Feeling bad or failure about yourself  0 0 0  Trouble concentrating 0 0 0  Moving slowly or fidgety/restless 0 0 0  Suicidal thoughts 0 0 0  PHQ-9 Score 0 0 0      Review of Systems:   Pertinent items are noted in HPI Denies any headaches, blurred vision, fatigue, shortness of breath, chest pain, abdominal pain, bowel movements, urination, or intercourse unless otherwise stated above.  Pertinent History Reviewed:  Reviewed past medical,surgical, social and family history.  Reviewed problem list, medications and allergies. Physical Assessment:   Vitals:   10/06/21 0829  BP: 102/67  Pulse: 76  Weight: 92 lb 3.2 oz (41.8 kg)  Height: '5\' 2"'$  (1.575 m)  Body mass index is 16.86 kg/m.        Physical Examination:   General appearance - well appearing, and in no distress  Mental status - alert, oriented to person, place, and time  Psych:  She has a normal mood and affect  Skin - warm and dry, normal color, no suspicious lesions noted  Chest - effort normal, all lung fields clear to auscultation bilaterally  Heart  - normal rate and regular rhythm  Neck:  midline trachea, no thyromegaly or nodules  Breasts - breasts appear normal, normal right breast, in left breast ~ 2cm mobile non-tender mass- known fibroadenoma no skin or nipple changes or  axillary nodes  Abdomen - soft, nontender, nondistended, no masses or organomegaly  Pelvic - VULVA: normal appearing vulva with no masses, tenderness or lesions  VAGINA: normal appearing vagina with normal color and discharge, no lesions  CERVIX: normal appearing cervix without discharge or lesions, no CMT  Thin prep pap is done with HR HPV cotesting  UTERUS: uterus is felt to be normal size, shape, consistency and nontender   ADNEXA: No adnexal masses or tenderness noted.  Extremities:  No swelling or varicosities noted  Chaperone: Alice Rieger     Assessment & Plan:  1) Well-Woman Exam -pap collected, reviewed ASCCP guidelines  2) Iron def. Anemia likely due to HMB -discussed all options ranging from TXA, OCPs to LARCs -plans to do some reading on her own and will get back to [place] -continue oral iron  -lab screening per work requirement  Orders Placed This Encounter  Procedures   Glucose, fasting   Lipid panel    Follow-up: Return in about 1 year (around 10/07/2022) for Annual.   Janyth Pupa, DO Attending Obstetrician &  Gynecologist, Product/process development scientist for [employer], [place]

## 2021-10-07 LAB — LIPID PANEL
Chol/HDL Ratio: 1.8 ratio (ref 0.0–4.4)
Cholesterol, Total: 155 mg/dL (ref 100–199)
HDL: 86 mg/dL (ref >39)
LDL Chol Calc (NIH): 57 mg/dL (ref 0–99)
Triglycerides: 57 mg/dL (ref 0–149)
VLDL Cholesterol Cal: 12 mg/dL (ref 5–40)

## 2021-10-07 LAB — GLUCOSE, FASTING: Glucose, Plasma: 85 mg/dL (ref 70–99)

## 2021-10-11 LAB — CYTOLOGY - PAP
Chlamydia: NEGATIVE
Comment: NEGATIVE
Comment: NEGATIVE
Comment: NEGATIVE
Comment: NORMAL
Diagnosis: NEGATIVE
High risk HPV: NEGATIVE
Neisseria Gonorrhea: NEGATIVE
Trichomonas: NEGATIVE

## 2022-07-02 IMAGING — DX DG FOOT COMPLETE 3+V*L*
3 series · 3 of 3 positions shown · non-contrast
Comparison: None.

CLINICAL DATA: Pain post blunt trauma

EXAM:
LEFT FOOT - COMPLETE 3+ VIEW

[foot ap]
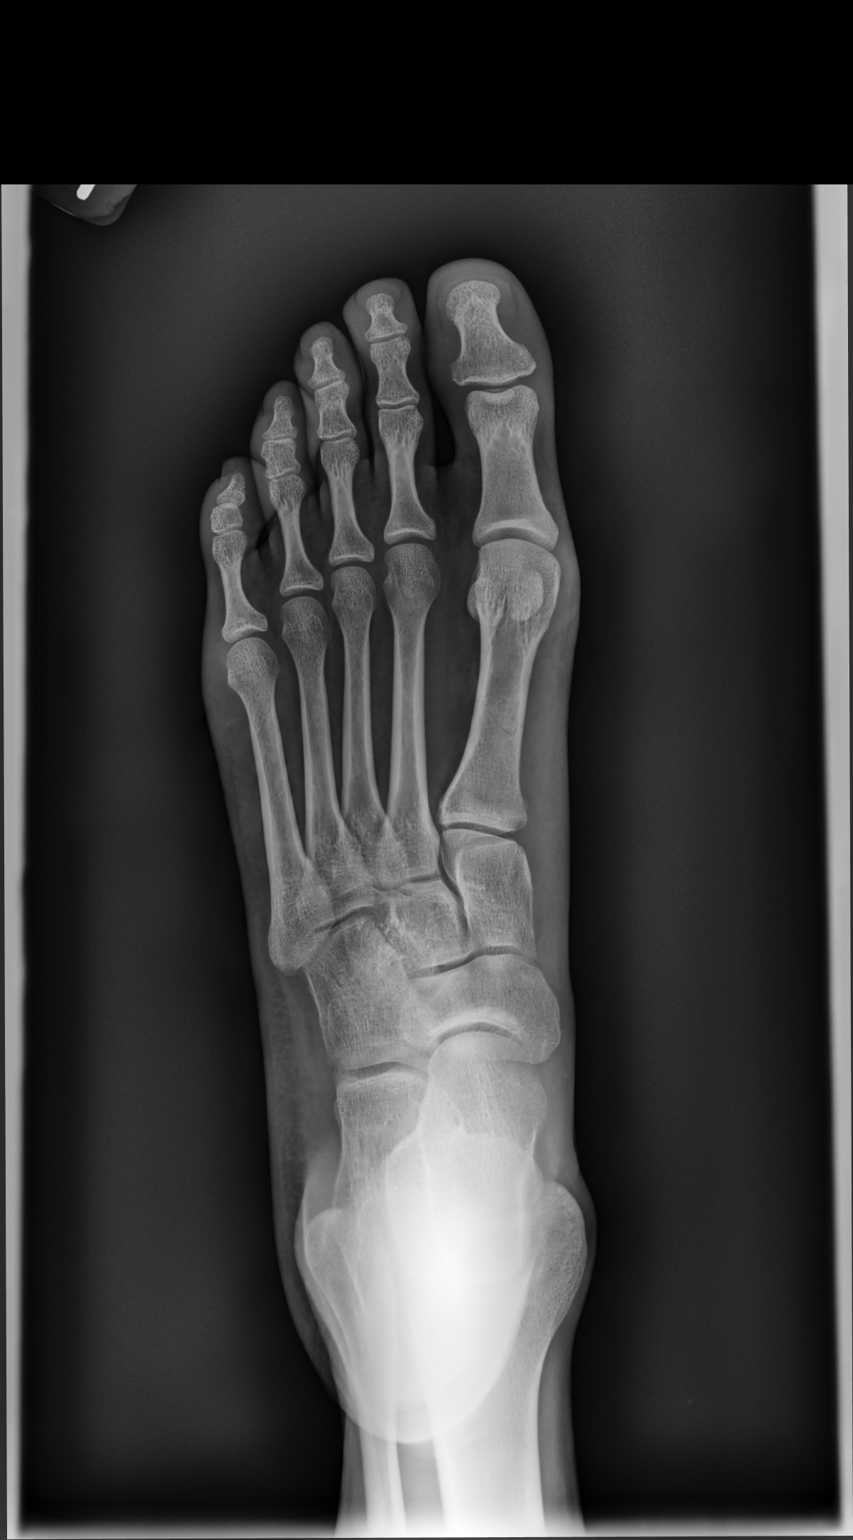

[foot mlo]
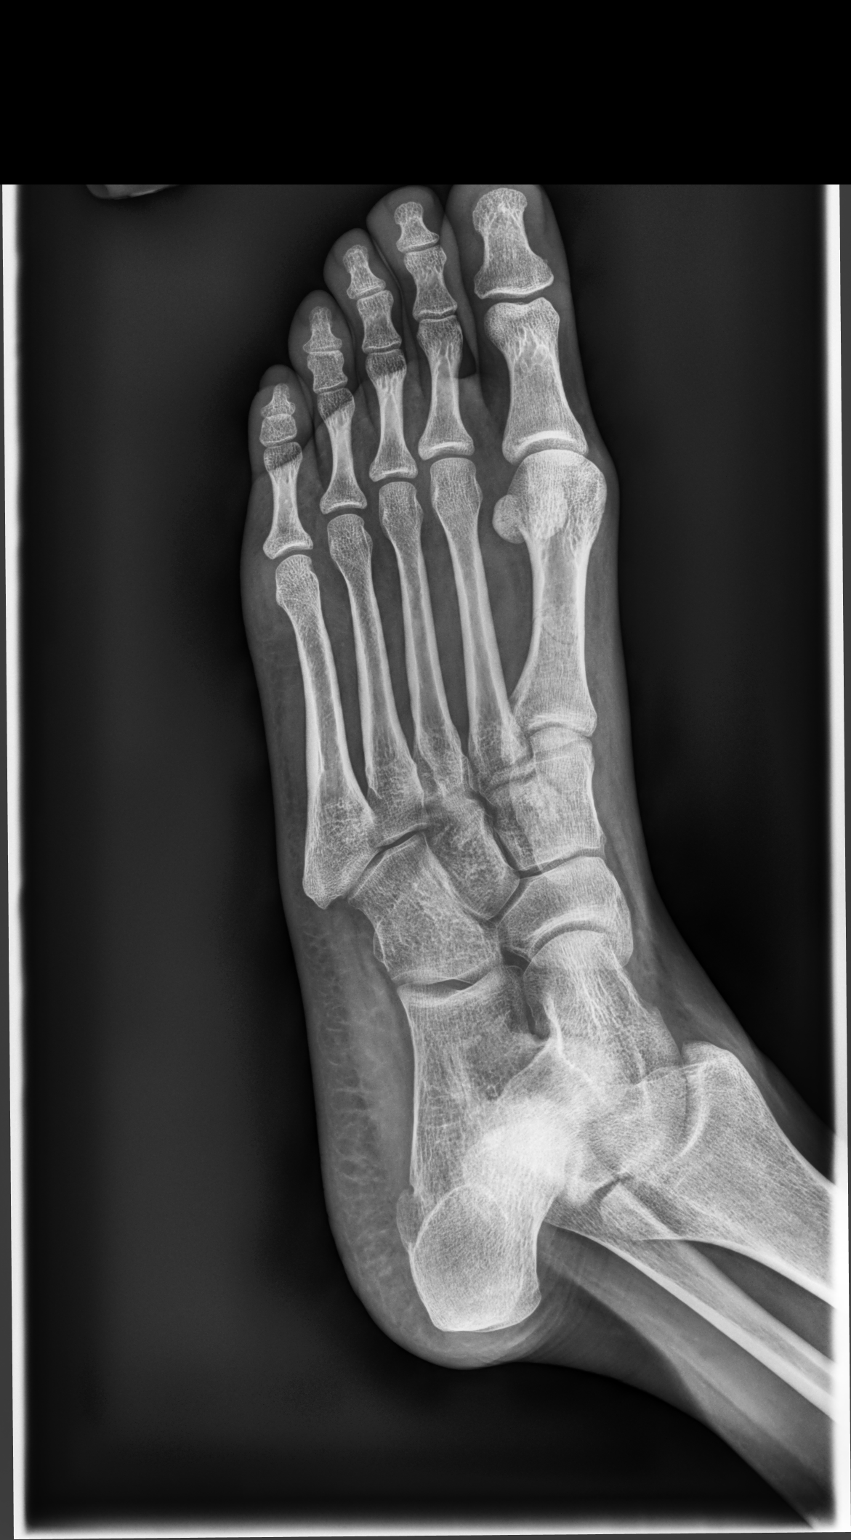

[foot lat]
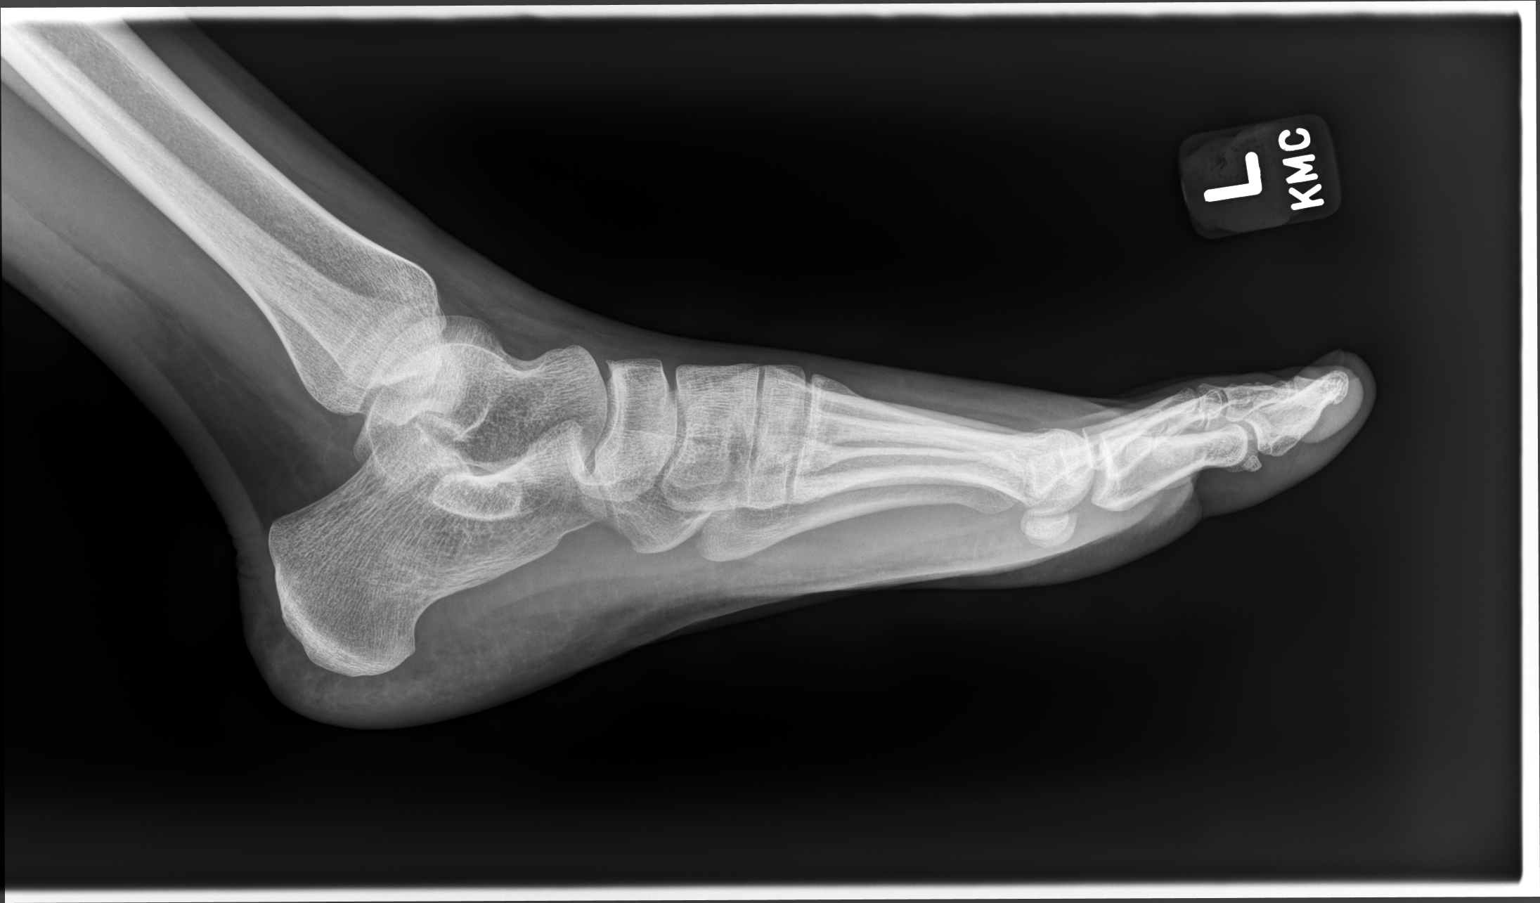

[3 of 3 positions shown; findings below may reference images not displayed]

FINDINGS: There is no evidence of fracture or dislocation. There is no
evidence of arthropathy or other focal bone abnormality. Soft
tissues are unremarkable.
IMPRESSION: Negative.

## 2023-02-25 ENCOUNTER — Ambulatory Visit (INDEPENDENT_AMBULATORY_CARE_PROVIDER_SITE_OTHER): Payer: BC Managed Care – PPO | Admitting: Family Medicine

## 2023-02-25 ENCOUNTER — Encounter: Payer: Self-pay | Admitting: Family Medicine

## 2023-02-25 VITALS — BP 114/72 | HR 102 | Temp 98.5°F | Ht 62.0 in | Wt 101.0 lb

## 2023-02-25 DIAGNOSIS — Z0000 Encounter for general adult medical examination without abnormal findings: Secondary | ICD-10-CM | POA: Diagnosis not present

## 2023-02-25 DIAGNOSIS — Z0001 Encounter for general adult medical examination with abnormal findings: Secondary | ICD-10-CM

## 2023-02-25 DIAGNOSIS — M5441 Lumbago with sciatica, right side: Secondary | ICD-10-CM

## 2023-02-25 DIAGNOSIS — G8929 Other chronic pain: Secondary | ICD-10-CM | POA: Diagnosis not present

## 2023-02-25 DIAGNOSIS — Z1159 Encounter for screening for other viral diseases: Secondary | ICD-10-CM

## 2023-02-25 DIAGNOSIS — R739 Hyperglycemia, unspecified: Secondary | ICD-10-CM | POA: Diagnosis not present

## 2023-02-25 DIAGNOSIS — Z13220 Encounter for screening for lipoid disorders: Secondary | ICD-10-CM | POA: Diagnosis not present

## 2023-02-25 DIAGNOSIS — Z23 Encounter for immunization: Secondary | ICD-10-CM | POA: Diagnosis not present

## 2023-02-25 NOTE — Progress Notes (Signed)
New Patient Office Visit  Subjective    Patient ID: Sandra Mueller, female    DOB: Jan 16, 1990  Age: 33 y.o. MRN: [id number]  CC:  Chief Complaint  Patient presents with   Establish Care    HPI Sandra Mueller presents to establish care. No recent PCP but does go to [facility] for well women's care. Oriented to practice routines and expectations. PMH includes scoliosis. Current concerns include intermittent chronic low back pain with right lower extremity numbness that is worse when driving or lying down. These symptoms are worsening lately. No trauma or injury and she denies saddle numbness, urinary or fecal incontinence, or lower extremity weakness.   Cervical CA screening: approximate date 09/2021 and was normal, scheduled for march 2025 Tobacco: non-smoker STI: declines Vaccines:  Tdap today, declines flu or covid    Outpatient Encounter Medications as of 02/25/2023  Medication Sig   FEROSUL 325 (65 Fe) MG tablet Take by mouth. (Patient not taking: Reported on 02/25/2023)   ibuprofen (ADVIL) 400 MG tablet Take 1 tablet (400 mg total) by mouth every 6 (six) hours as needed. (Patient not taking: Reported on 02/25/2023)   No facility-administered encounter medications on file as of 02/25/2023.    Past Medical History:  Diagnosis Date   Scoliosis     History reviewed. No pertinent surgical history.  Family History  Problem Relation Age of Onset   Scoliosis Maternal Grandmother    Hypertension Maternal Grandmother    Hypertension Mother     Social History   Socioeconomic History   Marital status: Single    Spouse name: Not on file   Number of children: Not on file   Years of education: Not on file   Highest education level: GED or equivalent  Occupational History   Not on file  Tobacco Use   Smoking status: Former    Types: Cigarettes   Smokeless tobacco: Never   Tobacco comments:    2 cigs per day  Vaping Use   Vaping status: Never Used  Substance and Sexual  Activity   Alcohol use: No    Comment: Occasional   Drug use: No   Sexual activity: Yes    Birth control/protection: None, Condom  Other Topics Concern   Not on file  Social History Narrative   Not on file   Social Drivers of Health   Financial Resource Strain: Low Risk  (02/21/2023)   Overall Financial Resource Strain (CARDIA)    Difficulty of Paying Living Expenses: Not very hard  Food Insecurity: No Food Insecurity (02/21/2023)   Hunger Vital Sign    Worried About Running Out of Food in the Last Year: Never true    Ran Out of Food in the Last Year: Never true  Transportation Needs: No Transportation Needs (02/21/2023)   PRAPARE - Administrator, Civil Service (Medical): No    Lack of Transportation (Non-Medical): No  Physical Activity: Insufficiently Active (02/21/2023)   Exercise Vital Sign    Days of Exercise per Week: 5 days    Minutes of Exercise per Session: 10 min  Stress: No Stress Concern Present (02/21/2023)   [employer] of Occupational Health - Occupational Stress Questionnaire    Feeling of Stress : Only a little  Social Connections: Unknown (02/21/2023)   Social Connection and Isolation Panel [NHANES]    Frequency of Communication with Friends and Family: More than three times a week    Frequency of Social Gatherings with Friends and Family: Once  a week    Attends Religious Services: Patient declined    Active Member of Clubs or Organizations: No    Attends Banker Meetings: Not on file    Marital Status: Never married  Intimate Partner Violence: Not At Risk (10/06/2021)   Humiliation, Afraid, Rape, and Kick questionnaire    Fear of Current or Ex-Partner: No    Emotionally Abused: No    Physically Abused: No    Sexually Abused: No    Review of Systems  Constitutional: Negative.   HENT: Negative.    Eyes: Negative.   Respiratory: Negative.    Cardiovascular: Negative.   Gastrointestinal: Negative.   Genitourinary: Negative.    Musculoskeletal: Negative.   Skin: Negative.   Neurological: Negative.   Endo/Heme/Allergies: Negative.   Psychiatric/Behavioral: Negative.    All other systems reviewed and are negative.       Objective    BP 114/72   Pulse (!) 102   Temp 98.5 F (36.9 C) (Oral)   Ht 5\' 2"  (1.575 m)   Wt 101 lb (45.8 kg)   LMP 02/11/2023 (Approximate)   SpO2 98%   BMI 18.47 kg/m   Physical Exam Vitals and nursing note reviewed.  Constitutional:      Appearance: Normal appearance. She is normal weight.  HENT:     Head: Normocephalic and atraumatic.     Right Ear: Tympanic membrane, ear canal and external ear normal.     Left Ear: Tympanic membrane, ear canal and external ear normal.     Nose: Nose normal.     Mouth/Throat:     Mouth: Mucous membranes are moist.     Pharynx: Oropharynx is clear.  Eyes:     Extraocular Movements: Extraocular movements intact.     Conjunctiva/sclera: Conjunctivae normal.     Pupils: Pupils are equal, round, and reactive to light.  Cardiovascular:     Rate and Rhythm: Normal rate and regular rhythm.     Pulses: Normal pulses.     Heart sounds: Normal heart sounds.  Pulmonary:     Effort: Pulmonary effort is normal.     Breath sounds: Normal breath sounds.  Abdominal:     General: Bowel sounds are normal.     Palpations: Abdomen is soft.  Musculoskeletal:        General: Normal range of motion.     Cervical back: Normal range of motion and neck supple.     Lumbar back: Bony tenderness present.  Skin:    General: Skin is warm and dry.     Capillary Refill: Capillary refill takes less than 2 seconds.  Neurological:     General: No focal deficit present.     Mental Status: She is alert and oriented to person, place, and time. Mental status is at baseline.  Psychiatric:        Mood and Affect: Mood normal.        Behavior: Behavior normal.        Thought Content: Thought content normal.        Judgment: Judgment normal.          Assessment & Plan:   Problem List Items Addressed This Visit     Physical exam, annual - Primary   Today your medical history was reviewed and routine physical exam with labs was performed. Recommend 150 minutes of moderate intensity exercise weekly and consuming a well-balanced diet. Advised to stop smoking if a smoker, avoid smoking if a non-smoker, limit alcohol  consumption to 1 drink per day for women and 2 drinks per day for men, and avoid illicit drug use. Counseled on safe sex practices and offered STI testing today. Counseled on the importance of sunscreen use. Counseled in mental health awareness and when to seek medical care. Vaccine maintenance discussed. Appropriate health maintenance items reviewed. Return to office in 1 year for annual physical exam.       Relevant Orders   CBC with Differential/Platelet   COMPLETE METABOLIC PANEL WITH GFR   LDL Cholesterol, Direct   Hepatitis C antibody   Chronic right-sided low back pain with right-sided sciatica   No red flags and this is chronic. Prior MRI showed some lumbar disc bulge at L4-5 without stenosis or nerve impingement. She does endorse bony tenderness. Discussed treatment options. Will proceed with x-ray and consider PT.      Relevant Orders   DG Lumbar Spine Complete   Other Visit Diagnoses       Need for hepatitis C screening test       Relevant Orders   Hepatitis C antibody     Screening for lipoid disorders       Relevant Orders   LDL Cholesterol, Direct       Return in about 1 year (around 02/25/2024) for annual physical with labs 1 week prior.   Park Meo, FNP

## 2023-02-25 NOTE — Assessment & Plan Note (Signed)
No red flags and this is chronic. Prior MRI showed some lumbar disc bulge at L4-5 without stenosis or nerve impingement. She does endorse bony tenderness. Discussed treatment options. Will proceed with x-ray and consider PT.

## 2023-02-25 NOTE — Assessment & Plan Note (Signed)

## 2023-02-25 NOTE — Patient Instructions (Addendum)
 It was great to meet you today and I'm excited to have you join the [facility] practice. I hope you had a positive experience today! If you feel so inclined, please feel free to recommend our practice to friends and family. Kurtis Bushman, FNP-C

## 2023-02-25 NOTE — Addendum Note (Signed)
Addended by: Arta Silence on: 02/25/2023 02:51 PM   Modules accepted: Orders

## 2023-02-26 ENCOUNTER — Encounter: Payer: Self-pay | Admitting: Family Medicine

## 2023-02-26 ENCOUNTER — Other Ambulatory Visit: Payer: Self-pay | Admitting: Family Medicine

## 2023-02-26 MED ORDER — FEROSUL 325 (65 FE) MG PO TABS: 325.0000 mg | ORAL_TABLET | ORAL | 1 refills | Status: DC

## 2023-02-27 LAB — COMPLETE METABOLIC PANEL WITH GFR
AG Ratio: 1.8 (calc) (ref 1.0–2.5)
ALT: 7 U/L (ref 6–29)
AST: 13 U/L (ref 10–30)
Albumin: 4.4 g/dL (ref 3.6–5.1)
Alkaline phosphatase (APISO): 42 U/L (ref 31–125)
BUN: 15 mg/dL (ref 7–25)
CO2: 26 mmol/L (ref 20–32)
Calcium: 9.6 mg/dL (ref 8.6–10.2)
Chloride: 103 mmol/L (ref 98–110)
Creat: 0.84 mg/dL (ref 0.50–0.97)
Globulin: 2.4 g/dL (ref 1.9–3.7)
Glucose, Bld: 109 mg/dL — ABNORMAL HIGH (ref 65–99)
Potassium: 4.2 mmol/L (ref 3.5–5.3)
Sodium: 138 mmol/L (ref 135–146)
Total Bilirubin: 0.5 mg/dL (ref 0.2–1.2)
Total Protein: 6.8 g/dL (ref 6.1–8.1)
eGFR: 95 mL/min/{1.73_m2} (ref >=60)

## 2023-02-27 LAB — CBC WITH DIFFERENTIAL/PLATELET
Absolute Lymphocytes: 2071 {cells}/uL (ref 850–3900)
Absolute Monocytes: 534 {cells}/uL (ref 200–950)
Basophils Absolute: 29 {cells}/uL (ref 0–200)
Basophils Relative: 0.5 %
Eosinophils Absolute: 58 {cells}/uL (ref 15–500)
Eosinophils Relative: 1 %
HCT: 33.5 % — ABNORMAL LOW (ref 35.0–45.0)
Hemoglobin: 10.5 g/dL — ABNORMAL LOW (ref 11.7–15.5)
MCH: 27.5 pg (ref 27.0–33.0)
MCHC: 31.3 g/dL — ABNORMAL LOW (ref 32.0–36.0)
MCV: 87.7 fL (ref 80.0–100.0)
MPV: 10.6 fL (ref 7.5–12.5)
Monocytes Relative: 9.2 %
Neutro Abs: 3109 {cells}/uL (ref 1500–7800)
Neutrophils Relative %: 53.6 %
Platelets: 388 10*3/uL (ref 140–400)
RBC: 3.82 10*6/uL (ref 3.80–5.10)
RDW: 13.1 % (ref 11.0–15.0)
Total Lymphocyte: 35.7 %
WBC: 5.8 10*3/uL (ref 3.8–10.8)

## 2023-02-27 LAB — TEST AUTHORIZATION

## 2023-02-27 LAB — HEPATITIS C ANTIBODY: Hepatitis C Ab: NONREACTIVE

## 2023-02-27 LAB — HEMOGLOBIN A1C
Hgb A1c MFr Bld: 5.3 %{Hb} (ref <5.7)
Mean Plasma Glucose: 105 mg/dL
eAG (mmol/L): 5.8 mmol/L

## 2023-02-27 LAB — LDL CHOLESTEROL, DIRECT: Direct LDL: 62 mg/dL (ref <100)

## 2023-03-14 ENCOUNTER — Encounter: Payer: Self-pay | Admitting: Obstetrics & Gynecology

## 2023-03-14 ENCOUNTER — Ambulatory Visit: Payer: BC Managed Care – PPO | Admitting: Obstetrics & Gynecology

## 2023-03-14 VITALS — BP 126/85 | HR 88 | Ht 62.0 in | Wt 100.6 lb

## 2023-03-14 DIAGNOSIS — Z01419 Encounter for gynecological examination (general) (routine) without abnormal findings: Secondary | ICD-10-CM

## 2023-03-14 DIAGNOSIS — N852 Hypertrophy of uterus: Secondary | ICD-10-CM | POA: Diagnosis not present

## 2023-03-14 DIAGNOSIS — N920 Excessive and frequent menstruation with regular cycle: Secondary | ICD-10-CM | POA: Diagnosis not present

## 2023-03-14 NOTE — Progress Notes (Signed)
 WELL-WOMAN EXAMINATION Patient name: Sandra Mueller MRN [id number]  Date of birth: 1990/03/20 Chief Complaint:   Gynecologic Exam  History of Present Illness:   Sandra Mueller is a 33 y.o. G0P0000  female being seen today for a routine well-woman exam.   Reports no acute gyn concerns   First 3 days of her period- very heavy- using both a pad/tampon and on occasion may need to change every hour, then bleeding improves.  Reports that she has been dealing with this for so long, she hasn't thought much of it.  Denies intermenstrual bleeding.  On occasion will note sharp pain and radiate to her back. Maybe last for a few moments and then resolves on its own.  Does not require medication.  [name] last menstrual period was 03/09/2023 (approximate).  The current method of family planning is  female partner .  Same partner   Last pap 09/2021.  Last mammogram: NA. Last colonoscopy: NA     03/14/2023    3:19 PM 02/25/2023   11:39 AM 10/06/2021    8:35 AM 09/26/2020    1:33 PM 07/27/2019   10:00 AM  Depression screen PHQ 2/9  Decreased Interest 0 0 0 0 0  Down, Depressed, Hopeless 0 0 0 0 0  PHQ - 2 Score 0 0 0 0 0  Altered sleeping 0 0 0 0 0  Tired, decreased energy 0 0 0 0 0  Change in appetite 0 0 0 0 0  Feeling bad or failure about yourself  0 0 0 0 0  Trouble concentrating 0 0 0 0 0  Moving slowly or fidgety/restless 0 0 0 0 0  Suicidal thoughts 0 0 0 0 0  PHQ-9 Score 0 0 0 0 0  Difficult doing work/chores  Not difficult at all         Review of Systems:   Pertinent items are noted in HPI Denies any headaches, blurred vision, fatigue, shortness of breath, chest pain, abdominal pain, bowel movements, urination, or intercourse unless otherwise stated above.  Pertinent History Reviewed:  Reviewed past medical,surgical, social and family history.  Reviewed problem list, medications and allergies. Physical Assessment:   Vitals:   03/14/23 1515  BP: 126/85  Pulse: 88  Weight:  100 lb 9.6 oz (45.6 kg)  Height: 5\' 2"  (1.575 m)  Body mass index is 18.4 kg/m.        Physical Examination:   General appearance - well appearing, and in no distress  Mental status - alert, oriented to person, place, and time  Psych:  She has a normal mood and affect  Skin - warm and dry, normal color, no suspicious lesions noted  Chest - effort normal, all lung fields clear to auscultation bilaterally  Heart - normal rate and regular rhythm  Neck:  midline trachea, no thyromegaly or nodules  Breasts -  in left breast ~ 2cm mobile non-tender mass- known fibroadenoma- unchanged from prior exam no suspicious masses, no skin or nipple changes or  axillary nodes  Abdomen - soft, nontender, nondistended, no masses or organomegaly  Pelvic - VULVA: normal appearing vulva with no masses, tenderness or lesions  VAGINA: normal appearing vagina with normal color and discharge, no lesions  CERVIX: normal appearing cervix without discharge or lesions, no CMT  UTERUS: Enlarged, mobile non-tender uterus  ADNEXA: No adnexal masses or tenderness noted.  Extremities:  No swelling or varicosities noted  Chaperone: Faith Rogue     Assessment & Plan:  1) Well-Woman  Exam -pap up to date, reviewed guidelines, next due 2026  2) HMB -reviewed prior lab work, Hgb 10.5.  Suspect anemia due to menses -discussed management options ranging from TXA to COC's to LARCs.  Reviewed risk benefit of each option -Patient wishes to review on her own and let us know what she decides  No orders of the defined types were placed in this encounter.   Meds: No orders of the defined types were placed in this encounter.   Follow-up: Return in about 1 year (around 03/13/2024) for Annual.   Myna Hidalgo, DO Attending Obstetrician & Gynecologist, [facility], [facility]

## 2023-03-26 ENCOUNTER — Encounter: Payer: Self-pay | Admitting: Family Medicine

## 2023-03-26 ENCOUNTER — Encounter: Payer: Self-pay | Admitting: Obstetrics & Gynecology

## 2023-03-27 ENCOUNTER — Other Ambulatory Visit: Payer: Self-pay | Admitting: Obstetrics & Gynecology

## 2023-03-27 DIAGNOSIS — N920 Excessive and frequent menstruation with regular cycle: Secondary | ICD-10-CM

## 2023-03-27 MED ORDER — TRANEXAMIC ACID 650 MG PO TABS: ORAL_TABLET | ORAL | 6 refills | Status: AC

## 2023-03-27 NOTE — Progress Notes (Signed)
 Rx for TXA Message sent to patient regarding use Plan to f/u in 3-4 mos  Myna Hidalgo, DO Attending Obstetrician & Gynecologist, [facility] for [facility], [facility]

## 2023-09-11 ENCOUNTER — Other Ambulatory Visit: Payer: Self-pay | Admitting: Family Medicine

## 2023-09-25 ENCOUNTER — Telehealth: Payer: Self-pay

## 2023-09-25 NOTE — Telephone Encounter (Signed)
 Pt brought in a physical form needed to completed by pcp for employer. Pt filled out an intake form, intake form and form to be completed placed in nurse's folder. Please contact pt when form is completed and available to pick up. Pt will need to pay $10 fee for form completion. Please advise  Cb#: [phone]

## 2023-09-26 NOTE — Telephone Encounter (Signed)
 2025 [facility] [employer] Primary Care Provider form from [employer]  for Frimy Uffelman has been completed and signed by provider Jeoffrey Barrio. Form gives vitals and lab results of last physical performed on 02/25/2023. Form has been copied and placed in scan. To be added to chart. Original has been placed in file folder for pick up and ms. Faivre has been contacted for pick up.  09/26/2023 11:04 am SRP, CMA

## 2023-10-02 ENCOUNTER — Telehealth: Payer: Self-pay

## 2023-10-02 NOTE — Telephone Encounter (Signed)
 Copied from CRM #[id number]. Topic: Clinical - Lab/Test Results >> Oct 02, 2023  3:00 PM Wess RAMAN wrote: Reason for CRM: Janna from [employer] would like the labs from 02/2023 for patient.  Fax: [phone] Callback #: [phone] ext: [phone]

## 2023-10-02 NOTE — Telephone Encounter (Signed)
 Fax completed and sent to [facility] at [phone]

## 2023-10-19 DIAGNOSIS — K047 Periapical abscess without sinus: Secondary | ICD-10-CM | POA: Diagnosis not present

## 2024-02-21 ENCOUNTER — Other Ambulatory Visit: Payer: BC Managed Care – PPO

## 2024-02-21 DIAGNOSIS — Z0000 Encounter for general adult medical examination without abnormal findings: Secondary | ICD-10-CM

## 2024-02-26 ENCOUNTER — Encounter: Payer: BC Managed Care – PPO | Admitting: Family Medicine
